# Patient Record
Sex: Male | Born: 1985 | Race: White | Hispanic: No | Marital: Married | Smoking: Former smoker
Health system: Southern US, Community
[De-identification: ages and names within clinical notes are randomized; demographics above are authoritative.]

## PROBLEM LIST (undated history)

## (undated) DIAGNOSIS — K219 Gastro-esophageal reflux disease without esophagitis: Secondary | ICD-10-CM

## (undated) HISTORY — PX: APPENDECTOMY: SHX54

## (undated) HISTORY — DX: Gastro-esophageal reflux disease without esophagitis: K21.9

---
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
## 2014-01-08 ENCOUNTER — Ambulatory Visit (INDEPENDENT_AMBULATORY_CARE_PROVIDER_SITE_OTHER): Payer: Medicaid Other | Admitting: Family Medicine

## 2014-01-08 ENCOUNTER — Encounter: Payer: Self-pay | Admitting: Family Medicine

## 2014-01-08 VITALS — BP 122/68 | Temp 98.0°F | Ht 69.0 in | Wt 210.0 lb

## 2014-01-08 DIAGNOSIS — K219 Gastro-esophageal reflux disease without esophagitis: Secondary | ICD-10-CM

## 2014-01-08 MED ORDER — ESOMEPRAZOLE MAGNESIUM 40 MG PO CPDR: 40.0000 mg | DELAYED_RELEASE_CAPSULE | Freq: Every day | ORAL | Status: DC

## 2014-01-08 NOTE — Patient Instructions (Signed)
Gastroesophageal Reflux Disease, Adult  Gastroesophageal reflux disease (GERD) happens when acid from your stomach goes into your food pipe (esophagus). The acid can cause a burning feeling in your chest. Over time, the acid can make small holes (ulcers) in your food pipe.   HOME CARE  · Ask your doctor for advice about:  · Losing weight.  · Quitting smoking.  · Alcohol use.  · Avoid foods and drinks that make your problems worse. You may want to avoid:  · Caffeine and alcohol.  · Chocolate.  · Mints.  · Garlic and onions.  · Spicy foods.  · Citrus fruits, such as oranges, lemons, or limes.  · Foods that contain tomato, such as sauce, chili, salsa, and pizza.  · Fried and fatty foods.  · Avoid lying down for 3 hours before you go to bed or before you take a nap.  · Eat small meals often, instead of large meals.  · Wear loose-fitting clothing. Do not wear anything tight around your waist.  · Raise (elevate) the head of your bed 6 to 8 inches with wood blocks. Using extra pillows does not help.  · Only take medicines as told by your doctor.  · Do not take aspirin or ibuprofen.  GET HELP RIGHT AWAY IF:   · You have pain in your arms, neck, jaw, teeth, or back.  · Your pain gets worse or changes.  · You feel sick to your stomach (nauseous), throw up (vomit), or sweat (diaphoresis).  · You feel short of breath, or you pass out (faint).  · Your throw up is green, yellow, black, or looks like coffee grounds or blood.  · Your poop (stool) is red, bloody, or black.  MAKE SURE YOU:   · Understand these instructions.  · Will watch your condition.  · Will get help right away if you are not doing well or get worse.  Document Released: 03/09/2008 Document Revised: 12/14/2011 Document Reviewed: 04/10/2011  ExitCare® Patient Information ©2014 [employer].

## 2014-01-09 ENCOUNTER — Telehealth: Payer: Self-pay | Admitting: *Deleted

## 2014-01-09 MED ORDER — PANTOPRAZOLE SODIUM 40 MG PO TBEC: 40.0000 mg | DELAYED_RELEASE_TABLET | Freq: Every day | ORAL | Status: DC

## 2014-01-09 NOTE — Telephone Encounter (Signed)
Prior Authorization received from [facility] for Nexium. Formulary and PA form placed in provider box for completion. Clovis PuMartin, Tamika L, RN

## 2014-01-09 NOTE — Telephone Encounter (Signed)
Medication changed to Protonix per Dr. Birdie SonsSonnenberg.  Rx sent to [facility] listed on pt's record.  Clovis PuMartin, Tamika L, RN

## 2014-01-11 ENCOUNTER — Encounter: Payer: Self-pay | Admitting: Family Medicine

## 2014-01-11 DIAGNOSIS — K219 Gastro-esophageal reflux disease without esophagitis: Secondary | ICD-10-CM | POA: Insufficient documentation

## 2014-01-11 NOTE — Assessment & Plan Note (Signed)
Patient with reflux. Nexium not covered by his insurance. Will treat with protonix. Asked that he bring his records from [place] in at his PE.

## 2014-01-11 NOTE — Progress Notes (Signed)
Patient ID: Aaron DoseClinton B Holmes, male   DOB: 10/29/1985, 28 y.o.   MRN: [id number]  Marikay AlarEric Sonnenberg, MD Phone: [phone]  Aaron Holmes is a 28 y.o. male who presents today for new patient visit.  GERD: patient with history of reflux for several years. He was previously treated in [place] for this with nexium. States he has been out of this medication for the past year. He has been buying the 20 mg tabs OTC and states these have not been working well. He has had to double up on these. He notes getting heart burn mostly at night. Also after eating spicey foods. Had an EGD in [place], they reportedly saw ulcers, though he does not think they took any biopsies.  Patient is a smoker.   Patient Information Form: Screening and ROS  Do you feel safe in relationships? yes PHQ-2:negative  Review of Symptoms  General:  Negative for nexplained weight loss, fever Skin: Negative for new or changing mole, sore that won't heal HEENT: Negative for trouble hearing, trouble seeing, ringing in ears, mouth sores, hoarseness, change in voice, dysphagia. CV:  Negative for chest pain, dyspnea, edema, palpitations Resp: Negative for cough, dyspnea, hemoptysis GI: Positive for acid reflux, Negative for nausea, vomiting, diarrhea, constipation, abdominal pain, melena, hematochezia. GU: Negative for dysuria, incontinence, urinary hesitance, hematuria, vaginal or penile discharge, polyuria, sexual difficulty, lumps in testicle or breasts MSK: Negative for muscle cramps or aches, joint pain or swelling Neuro: Positive for HA, Negative for weakness, numbness, dizziness, passing out/fainting Psych: Negative for depression, anxiety, memory problems    Physical Exam Filed Vitals:   01/08/14 1508  BP: 122/68  Temp: 98 F (36.7 C)    Gen: Well NAD HEENT: EOMI,  MMM Lungs: CTABL Nl WOB Heart: RRR no MRG Abd: NABS, NT, ND Exts: Non edematous BL  LE, warm and well perfused.   Assessment/Plan: Please see  individual problem list.

## 2014-03-01 ENCOUNTER — Ambulatory Visit (INDEPENDENT_AMBULATORY_CARE_PROVIDER_SITE_OTHER): Payer: Medicaid Other | Admitting: Family Medicine

## 2014-03-01 ENCOUNTER — Encounter: Payer: Self-pay | Admitting: Family Medicine

## 2014-03-01 VITALS — BP 126/77 | HR 90 | Temp 98.2°F | Ht 69.0 in | Wt 200.0 lb

## 2014-03-01 DIAGNOSIS — M79609 Pain in unspecified limb: Secondary | ICD-10-CM

## 2014-03-01 DIAGNOSIS — M79603 Pain in arm, unspecified: Secondary | ICD-10-CM

## 2014-03-01 DIAGNOSIS — Z23 Encounter for immunization: Secondary | ICD-10-CM

## 2014-03-01 NOTE — Progress Notes (Signed)
Patient ID: Aaron Holmes, male   DOB: [birth year], 28 y.o.   MRN: [id number]  Aaron Alar, MD Phone: [phone]  Aaron Holmes is a 28 y.o. male who presents today for new problem.  Patient reports bilateral arm pain for the last 2-3 weeks. Notes this started after placing a stainless steel bracelet on his right hand. He notes he removed this and the pain went away. He then noted pain in his left hand with his wedding band and this went away after removing his wedding band. Pain is located from the MCP joints in both hands moving up the back of his forearm. States the pain is not present at this time. Other than removing the metal bracelet and his wedding ring nothing had made this better. Nothing has made it worse. He has not taken any medications for this. He denies any weakness, numbness, or tingling. He has never had this before. He notes he does lots of repetitive movements with his hands at his new job.  Patient is a smoker.   ROS: Per HPI   Physical Exam Filed Vitals:   03/01/14 1339  BP: 126/77  Pulse: 90  Temp: 98.2 F (36.8 C)    Gen: Well NAD MSK: bilateral UE without signs of swelling or erythema, patient has full ROM in wrists, elbows, and shoulders, there is no tenderness to palpation at any point in bilateral UE, negative tinnel sign bilaterall Neuro: 5/5 strength in bilateral biceps, triceps, deltoids, and grip, sensation to light touch intact in bilateral UE, 2+ reflexes bilateral biceps and brachioradialis   Assessment/Plan: Please see individual problem list.

## 2014-03-01 NOTE — Patient Instructions (Signed)
Nice to see you. Unfortunately I do not have a great explanation for your pain. Please try the exercises provided to help relive some of the pain if it recurs. You can also try avoiding wearing metal on your hands and wrists. If not improving please let me know.

## 2014-03-01 NOTE — Assessment & Plan Note (Signed)
Patient with bilateral arm pain that has now resolved with removal of metal objects. Nothing on exam to point to a specific etiology. Unlikely to be related to wearing metal, though may have been related to compression with the bracelet. I have advised the patient to avoid wearing anything on his wrists and hands at this time. I gave him some exercises for carpal tunnel as that would be my best guess as a cause at this time. Advised to f/u prn if not staying improved.

## 2014-03-27 ENCOUNTER — Ambulatory Visit (INDEPENDENT_AMBULATORY_CARE_PROVIDER_SITE_OTHER): Payer: Medicaid Other | Admitting: Family Medicine

## 2014-03-27 ENCOUNTER — Encounter: Payer: Self-pay | Admitting: Family Medicine

## 2014-03-27 VITALS — BP 119/81 | HR 88 | Temp 98.1°F | Wt 197.0 lb

## 2014-03-27 DIAGNOSIS — Z13220 Encounter for screening for lipoid disorders: Secondary | ICD-10-CM

## 2014-03-27 DIAGNOSIS — T148 Other injury of unspecified body region: Secondary | ICD-10-CM

## 2014-03-27 DIAGNOSIS — Z136 Encounter for screening for cardiovascular disorders: Secondary | ICD-10-CM

## 2014-03-27 DIAGNOSIS — W57XXXA Bitten or stung by nonvenomous insect and other nonvenomous arthropods, initial encounter: Secondary | ICD-10-CM

## 2014-03-27 DIAGNOSIS — Z0000 Encounter for general adult medical examination without abnormal findings: Secondary | ICD-10-CM | POA: Insufficient documentation

## 2014-03-27 MED ORDER — BACITRACIN 500 UNIT/GM EX OINT: 1.0000 "application " | TOPICAL_OINTMENT | Freq: Two times a day (BID) | CUTANEOUS | Status: DC

## 2014-03-27 NOTE — Assessment & Plan Note (Addendum)
Patient with normal blood pressure. Discussed smoking cessation, though patient with out the money at this time to afford the patch. He is to let us know if he desires help in the future in quitting. Lipid panel collected today. Reports previous negative HIV test. Will see back in one year for CPE.

## 2014-03-27 NOTE — Patient Instructions (Signed)
Nice to see you. Please use the antibiotic ointment on your bug bites. If they get worse or spread please let us know. If you develop fever or chills please seek medical attention. If you want help quitting smoking in the future please let us know. I will see you back in one year.

## 2014-03-27 NOTE — Progress Notes (Signed)
Patient ID: Aaron Holmes, male   DOB: 04/04/1986, 28 y.o.   MRN: [id number]  Aaron AlarEric Alfio Loescher, MD Phone: [phone]  Aaron Holmes is a 28 y.o. male who presents today for CPE.  Bug bites: patient notes 2 weekends ago he was at a pool. The next day he noted bug bites on his legs bilaterally. They itched. They have been draining clear fluid. There is mild erythema around them. He states they have gotten worse. He has used hydrocortisone cream on them and benadryl with minimal benefit. He notes his father in law has similar lesions. No one he lives with has had these lesions. He did not see what bug bit him.  Patient is a current smoker. He notes previous use of marijuana. He drinks alcohol 2-4x/month.  No new medical issues. He notes he was tested for HIV while in the [employer]. This was negative. He is sexually active with only his wife. No condoms used.  Review of Symptoms  General:  Negative for unexplained weight loss, fever Skin: Positive for bug bites HEENT: Negative for trouble hearing, trouble seeing CV:  Negative for chest pain, dyspnea, edema Resp: Negative for cough, dyspnea GI: Negative for nausea, vomiting, diarrhea, abdominal pain GU: Negative for dysuria, incontinence, urinary hesitance MSK: Negative for muscle cramps or aches, joint pain or swelling Neuro: Negative for headaches, weakness Psych: Negative for depression, anxiety, memory problems    Physical Exam Filed Vitals:   03/27/14 1427  BP: 119/81  Pulse: 88  Temp: 98.1 F (36.7 C)    Gen: Well NAD HEENT: PERRL,  MMM Neck: no cervical LAD Lungs: CTABL Nl WOB Heart: RRR no MRG Abd: NABS, NT, ND Neuro: alert, moves all 4 extremities equally Exts: Non edematous BL  LE, warm and well perfused.  Skin: scattered excoriated lesions with intermittent pustules on bilateral LE, small amount of erythema at edges of excoriated areas  Assessment/Plan: Please see individual problem list.  # Healthcare  maintenance: lipid panel ordered

## 2014-03-27 NOTE — Assessment & Plan Note (Signed)
Patient with bug bites from unknown bug. Excoriated and will give bacitracin ointment to help prevent secondary infection. No other house hold members have these and given distribution and pattern doubt scabies or bed bugs. Can continue benadryl for itching. Given return precautions.

## 2014-03-28 LAB — LIPID PANEL
CHOLESTEROL: 178 mg/dL (ref 0–200)
HDL: 34 mg/dL — ABNORMAL LOW (ref >39)
LDL CALC: 70 mg/dL (ref 0–99)
TRIGLYCERIDES: 372 mg/dL — AB (ref <150)
Total CHOL/HDL Ratio: 5.2 Ratio
VLDL: 74 mg/dL — AB (ref 0–40)

## 2014-04-04 ENCOUNTER — Encounter: Payer: Self-pay | Admitting: Family Medicine

## 2014-07-25 ENCOUNTER — Other Ambulatory Visit: Payer: Self-pay | Admitting: Family Medicine

## 2014-08-17 ENCOUNTER — Ambulatory Visit: Payer: Medicaid Other | Admitting: Family Medicine

## 2014-12-30 ENCOUNTER — Emergency Department (HOSPITAL_COMMUNITY)
Admission: EM | Admit: 2014-12-30 | Discharge: 2014-12-30 | Disposition: A | Discharge status: Home / Self Care | Payer: Medicaid Other | Attending: Emergency Medicine | Admitting: Emergency Medicine

## 2014-12-30 ENCOUNTER — Encounter (HOSPITAL_COMMUNITY): Payer: Self-pay | Admitting: Emergency Medicine

## 2014-12-30 ENCOUNTER — Emergency Department (HOSPITAL_COMMUNITY): Payer: Medicaid Other

## 2014-12-30 DIAGNOSIS — Z720 Tobacco use: Secondary | ICD-10-CM | POA: Diagnosis not present

## 2014-12-30 DIAGNOSIS — K219 Gastro-esophageal reflux disease without esophagitis: Secondary | ICD-10-CM | POA: Diagnosis not present

## 2014-12-30 DIAGNOSIS — Y9389 Activity, other specified: Secondary | ICD-10-CM | POA: Diagnosis not present

## 2014-12-30 DIAGNOSIS — Y998 Other external cause status: Secondary | ICD-10-CM | POA: Insufficient documentation

## 2014-12-30 DIAGNOSIS — Z792 Long term (current) use of antibiotics: Secondary | ICD-10-CM | POA: Insufficient documentation

## 2014-12-30 DIAGNOSIS — S61012A Laceration without foreign body of left thumb without damage to nail, initial encounter: Secondary | ICD-10-CM | POA: Diagnosis present

## 2014-12-30 DIAGNOSIS — W260XXA Contact with knife, initial encounter: Secondary | ICD-10-CM | POA: Diagnosis not present

## 2014-12-30 DIAGNOSIS — Y9289 Other specified places as the place of occurrence of the external cause: Secondary | ICD-10-CM | POA: Diagnosis not present

## 2014-12-30 DIAGNOSIS — Z79899 Other long term (current) drug therapy: Secondary | ICD-10-CM | POA: Insufficient documentation

## 2014-12-30 MED ORDER — LIDOCAINE HCL 2 % IJ SOLN
10.0000 mL | Freq: Once | INTRAMUSCULAR | Status: AC
  Administered 2014-12-30: 200 mg via INTRADERMAL
  Filled 2014-12-30: qty 20

## 2014-12-30 NOTE — ED Notes (Signed)
Patient transported to X-ray 

## 2014-12-30 NOTE — ED Notes (Addendum)
Pt states that he cut his thumb with a pocket knife. The area is about 1 inch long. Bleeding is controlled at this time.

## 2014-12-30 NOTE — ED Notes (Addendum)
Tatyana PA at bedside   

## 2014-12-30 NOTE — ED Notes (Signed)
Pt reports approx 1 hour ago cut L thumb with pocket knife. Bleeding controlled. Last tetanus 3 year ago.

## 2014-12-30 NOTE — ED Provider Notes (Signed)
CSN: [id number]     Arrival date & time 12/30/14  1906 History  This chart was scribed for non-physician practitioner, Jaynie Crumbleatyana Jamiaya Bina, PA-C working with Samuel JesterKathleen McManus, DO by Greggory StallionKayla Andersen, ED scribe. This patient was seen in room TR08C/TR08C and the patient's care was started at 7:30 PM.    Chief Complaint  Patient presents with  . Extremity Laceration   The history is provided by the patient. No language interpreter was used.    HPI Comments: Aaron Holmes is a 29 y.o. male who presents to the Emergency Department complaining of a laceration to his left thumb that occurred about one hour ago. Pt accidentally cut himself with a pocket knife. It was clean. He reports mild pain around the area. Pt's last tetanus was 3 years ago.   Past Medical History  Diagnosis Date  . GERD (gastroesophageal reflux disease)    Past Surgical History  Procedure Laterality Date  . Appendectomy     Family History  Problem Relation Age of Onset  . Diabetes Mother   . Heart disease Father   . Hypertension Father   . Alzheimer's disease Maternal Grandmother   . Cancer Maternal Grandfather   . Diabetes Paternal Grandmother   . Stroke Paternal Grandmother    History  Substance Use Topics  . Smoking status: Current Every Day Smoker  . Smokeless tobacco: Not on file     Comment: 1 pack last 2-3 days  . Alcohol Use: Yes     Comment: 2-4x/month    Review of Systems  Musculoskeletal: Positive for arthralgias.  Skin: Positive for wound.  All other systems reviewed and are negative.  Allergies  Review of patient's allergies indicates no known allergies.  Home Medications   Prior to Admission medications   Medication Sig Start Date End Date Taking? Authorizing Provider  bacitracin 500 UNIT/GM ointment Apply 1 application topically 2 (two) times daily. 03/27/14   Glori LuisEric G Sonnenberg, MD  pantoprazole (PROTONIX) 40 MG tablet TAKE 1 TABLET (40 MG TOTAL) BY MOUTH DAILY. 07/25/14   Glori LuisEric G  Sonnenberg, MD   BP 135/75 mmHg  Pulse 91  Temp(Src) 98.2 F (36.8 C) (Oral)  Resp 18  Ht 5\' 9"  (1.753 m)  Wt 205 lb (92.987 kg)  BMI 30.26 kg/m2  SpO2 95%   Physical Exam  Constitutional: He is oriented to person, place, and time. He appears well-developed and well-nourished. No distress.  HENT:  Head: Normocephalic and atraumatic.  Eyes: Conjunctivae and EOM are normal.  Neck: Neck supple. No tracheal deviation present.  Cardiovascular: Normal rate.   Pulmonary/Chest: Effort normal. No respiratory distress.  Musculoskeletal: Normal range of motion.  3 cm deep laceration over IP joint of palmar surface of the left thumb. Full range of motion of the thumb. Full strength with flexion and extension against resistance. Cap refill less than 2 seconds distally. Sensation is intact over palmar and dorsal surfaces of the thumb.  Neurological: He is alert and oriented to person, place, and time.  Skin: Skin is warm and dry.  Psychiatric: He has a normal mood and affect. His behavior is normal.  Nursing note and vitals reviewed.   ED Course  Procedures (including critical care time)  LACERATION REPAIR PROCEDURE NOTE The patient's identification was confirmed and consent was obtained. This procedure was performed by Jaynie Crumbleatyana Shirin Echeverry, PA-C at 8:23 PM. Site: left thumb Sterile procedures observed Anesthetic used (type and amt): 4 mL 2% lidocaine without epi Suture type/size: 5-0 Prolene Length: 3 cm #  of Sutures: 6 Technique: simple interrupted Complexity: simple  Antibx ointment applied Tetanus UTD Site anesthetized, irrigated with NS, explored without evidence of foreign body, wound well approximated, site covered with dry, sterile dressing.  Patient tolerated procedure well without complications. Instructions for care discussed verbally and patient provided with additional written instructions for homecare and f/u.  DIAGNOSTIC STUDIES: Oxygen Saturation is 95% on RA,  adequate by my interpretation.    COORDINATION OF CARE: 7:32 PM-Discussed treatment plan which includes xray with pt at bedside and pt agreed to plan.   8:18 PM-Advised pt of xray results. Will repair laceration and discharge home.   Labs Review Labs Reviewed - No data to display  Imaging Review No results found.   EKG Interpretation None      MDM   Final diagnoses:  Laceration of thumb, left, initial encounter    patient had laceration to the palmar surface of the left thumb. Strength, neurovascularly is intact. X-rays negative. Wound repaired with sutures. Wound appears to be clean. Topical about X follow-up with primary care doctor as needed.   Filed Vitals:   12/30/14 1910 12/30/14 2057  BP: 135/75 124/72  Pulse: 91 80  Temp: 98.2 F (36.8 C) 98 F (36.7 C)  TempSrc: Oral Oral  Resp: 18 19  Height:  (1.753 m)   Weight: 205 lb (92.987 kg)   SpO2: 95% 99%    I personally performed the services described in this documentation, which was scribed in my presence. The recorded information has been reviewed and is accurate.   Jaynie Crumble, PA-C 12/30/14 2246  Samuel Jester, DO 12/31/14 0127

## 2014-12-30 NOTE — Discharge Instructions (Signed)
Keep your laceration cleaned. Bacitracin ointment twice a day. Keep it covered. Follow-up in 7 days for suture removal. Watch for any signs of infection. You can take ibuprofen or Tylenol for pain as needed  Laceration Care, Adult A laceration is a cut or lesion that goes through all layers of the skin and into the tissue just beneath the skin. TREATMENT  Some lacerations may not require closure. Some lacerations may not be able to be closed due to an increased risk of infection. It is important to see your caregiver as soon as possible after an injury to minimize the risk of infection and maximize the opportunity for successful closure. If closure is appropriate, pain medicines may be given, if needed. The wound will be cleaned to help prevent infection. Your caregiver will use stitches (sutures), staples, wound glue (adhesive), or skin adhesive strips to repair the laceration. These tools bring the skin edges together to allow for faster healing and a better cosmetic outcome. However, all wounds will heal with a scar. Once the wound has healed, scarring can be minimized by covering the wound with sunscreen during the day for 1 full year. HOME CARE INSTRUCTIONS  For sutures or staples:  Keep the wound clean and dry.  If you were given a bandage (dressing), you should change it at least once a day. Also, change the dressing if it becomes wet or dirty, or as directed by your caregiver.  Wash the wound with soap and water 2 times a day. Rinse the wound off with water to remove all soap. Pat the wound dry with a clean towel.  After cleaning, apply a thin layer of the antibiotic ointment as recommended by your caregiver. This will help prevent infection and keep the dressing from sticking.  You may shower as usual after the first 24 hours. Do not soak the wound in water until the sutures are removed.  Only take over-the-counter or prescription medicines for pain, discomfort, or fever as directed by  your caregiver.  Get your sutures or staples removed as directed by your caregiver. For skin adhesive strips:  Keep the wound clean and dry.  Do not get the skin adhesive strips wet. You may bathe carefully, using caution to keep the wound dry.  If the wound gets wet, pat it dry with a clean towel.  Skin adhesive strips will fall off on their own. You may trim the strips as the wound heals. Do not remove skin adhesive strips that are still stuck to the wound. They will fall off in time. For wound adhesive:  You may briefly wet your wound in the shower or bath. Do not soak or scrub the wound. Do not swim. Avoid periods of heavy perspiration until the skin adhesive has fallen off on its own. After showering or bathing, gently pat the wound dry with a clean towel.  Do not apply liquid medicine, cream medicine, or ointment medicine to your wound while the skin adhesive is in place. This may loosen the film before your wound is healed.  If a dressing is placed over the wound, be careful not to apply tape directly over the skin adhesive. This may cause the adhesive to be pulled off before the wound is healed.  Avoid prolonged exposure to sunlight or tanning lamps while the skin adhesive is in place. Exposure to ultraviolet light in the first year will darken the scar.  The skin adhesive will usually remain in place for 5 to 10 days, then naturally  fall off the skin. Do not pick at the adhesive film. You may need a tetanus shot if:  You cannot remember when you had your last tetanus shot.  You have never had a tetanus shot. If you get a tetanus shot, your arm may swell, get red, and feel warm to the touch. This is common and not a problem. If you need a tetanus shot and you choose not to have one, there is a rare chance of getting tetanus. Sickness from tetanus can be serious. SEEK MEDICAL CARE IF:   You have redness, swelling, or increasing pain in the wound.  You see a red line that goes  away from the wound.  You have yellowish-white fluid (pus) coming from the wound.  You have a fever.  You notice a bad smell coming from the wound or dressing.  Your wound breaks open before or after sutures have been removed.  You notice something coming out of the wound such as wood or glass.  Your wound is on your hand or foot and you cannot move a finger or toe. SEEK IMMEDIATE MEDICAL CARE IF:   Your pain is not controlled with prescribed medicine.  You have severe swelling around the wound causing pain and numbness or a change in color in your arm, hand, leg, or foot.  Your wound splits open and starts bleeding.  You have worsening numbness, weakness, or loss of function of any joint around or beyond the wound.  You develop painful lumps near the wound or on the skin anywhere on your body. MAKE SURE YOU:   Understand these instructions.  Will watch your condition.  Will get help right away if you are not doing well or get worse. Document Released: 09/21/2005 Document Revised: 12/14/2011 Document Reviewed: 03/17/2011 [facility] Patient Information 2015 [place], [place]. This information is not intended to replace advice given to you by your health care provider. Make sure you discuss any questions you have with your health care provider.

## 2015-01-13 ENCOUNTER — Other Ambulatory Visit: Payer: Self-pay | Admitting: Family Medicine

## 2015-08-23 ENCOUNTER — Other Ambulatory Visit: Payer: Self-pay | Admitting: *Deleted

## 2015-08-23 MED ORDER — PANTOPRAZOLE SODIUM 40 MG PO TBEC: 40.0000 mg | DELAYED_RELEASE_TABLET | Freq: Every day | ORAL | Status: DC

## 2015-10-13 IMAGING — CR DG FINGER THUMB 2+V*L*
3 series · 3 of 3 positions shown · non-contrast
Comparison: None.

CLINICAL DATA: Recent injury with pocket knife, initial encounter

EXAM:
LEFT THUMB 2+V

[finger ap]
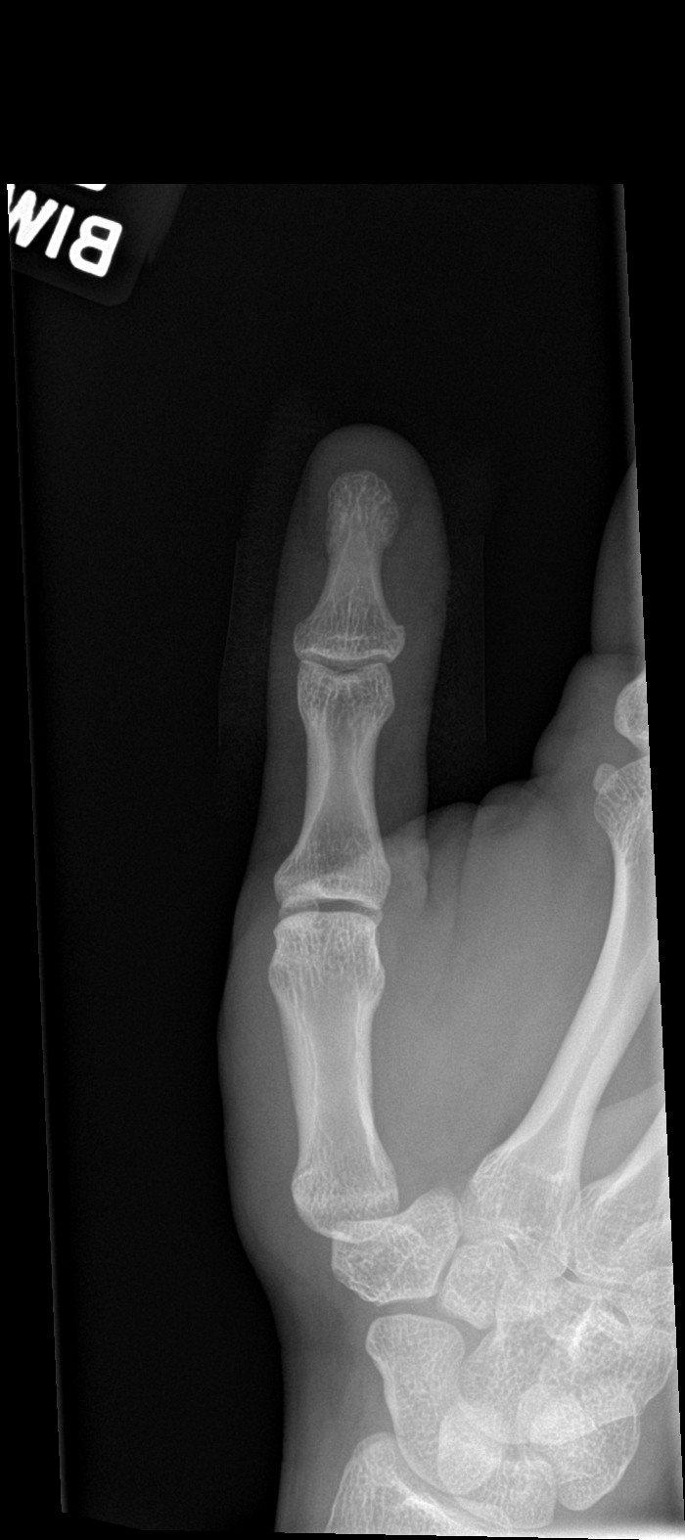

[finger obl]
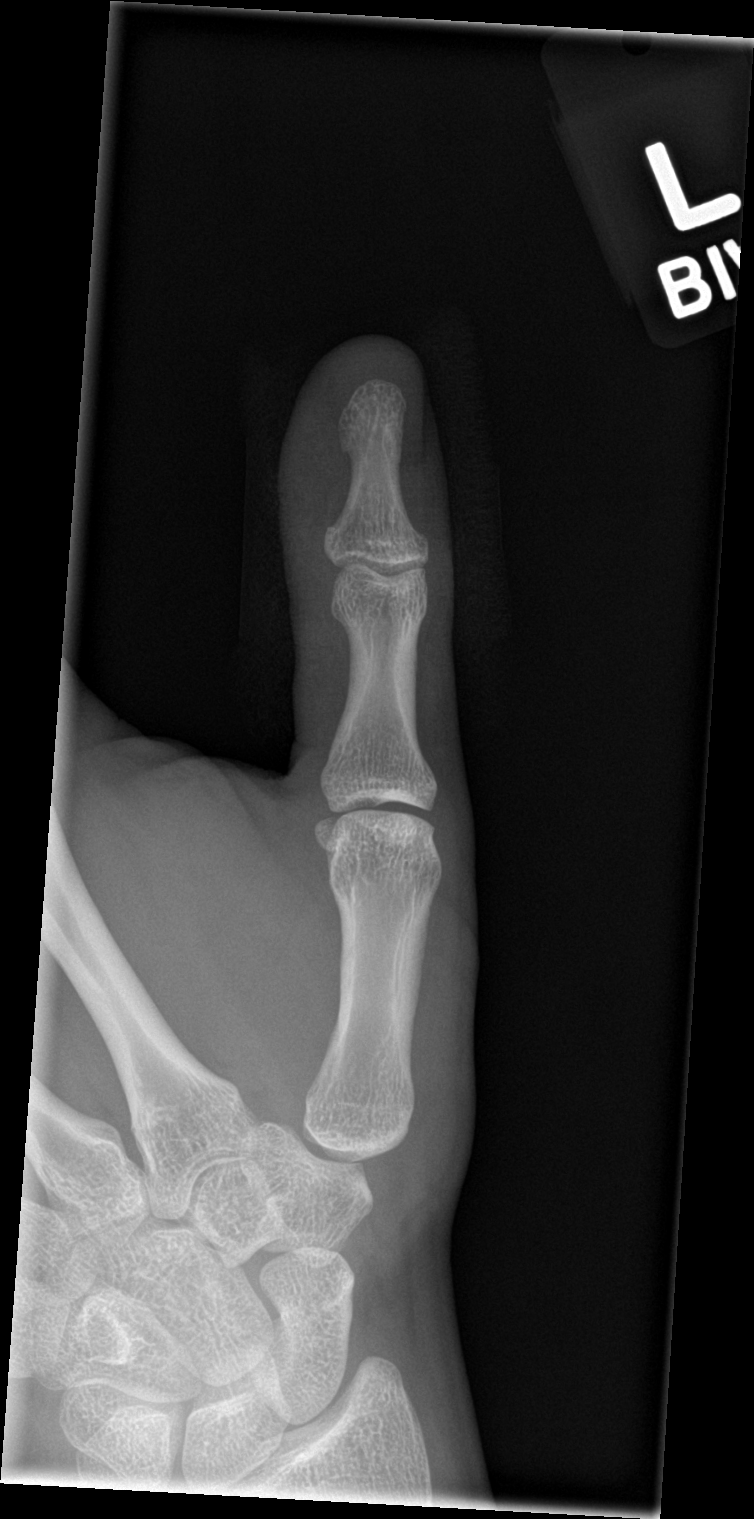

[finger lat]
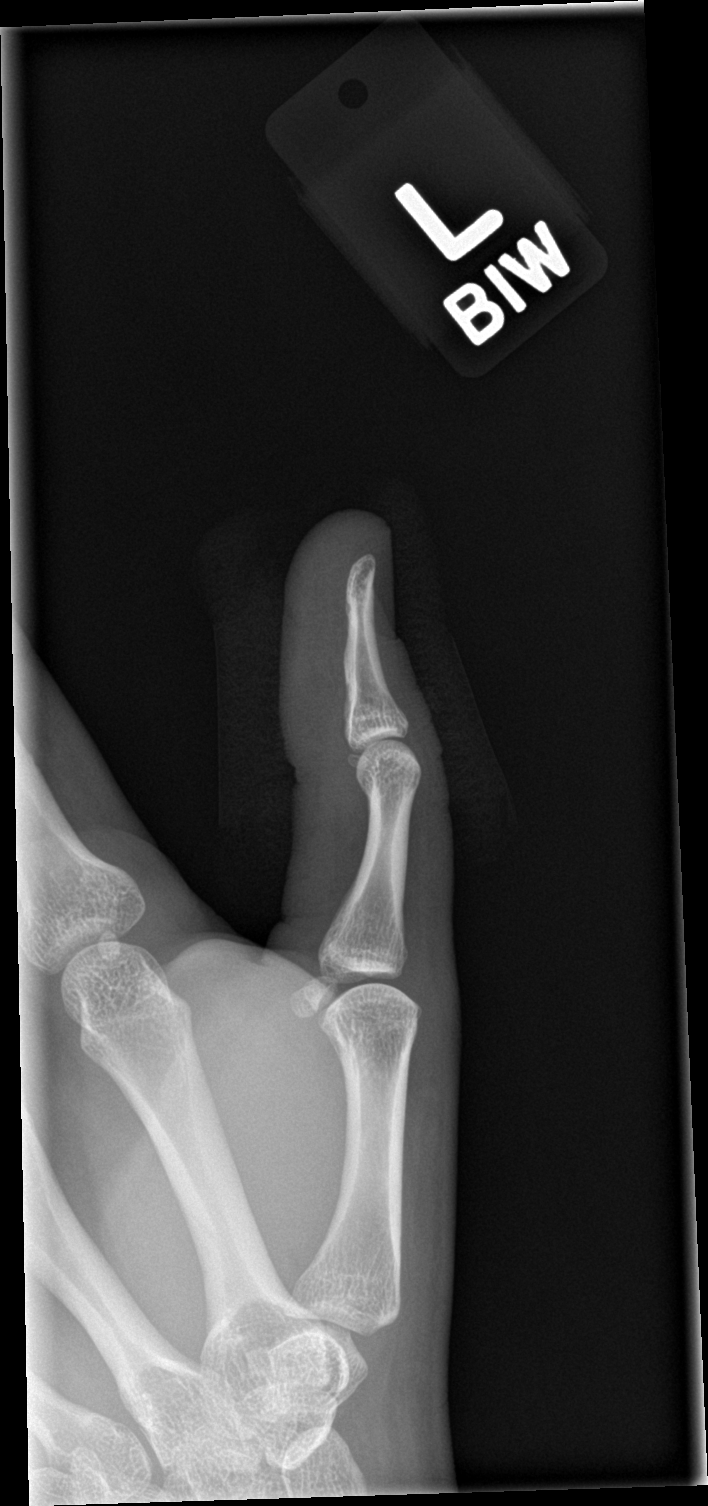

[3 of 3 positions shown; findings below may reference images not displayed]

FINDINGS: No radiopaque foreign body is noted. No acute bony abnormality is
seen. Mild soft tissue irregularity is noted consistent with the
given clinical history.
IMPRESSION: Soft tissue laceration without bony abnormality.

## 2016-01-06 ENCOUNTER — Emergency Department (HOSPITAL_COMMUNITY)
Admission: EM | Admit: 2016-01-06 | Discharge: 2016-01-06 | Disposition: A | Discharge status: Home / Self Care | Payer: Managed Care, Other (non HMO) | Attending: Emergency Medicine | Admitting: Emergency Medicine

## 2016-01-06 ENCOUNTER — Encounter (HOSPITAL_COMMUNITY): Payer: Self-pay

## 2016-01-06 DIAGNOSIS — Z79899 Other long term (current) drug therapy: Secondary | ICD-10-CM | POA: Diagnosis not present

## 2016-01-06 DIAGNOSIS — F17200 Nicotine dependence, unspecified, uncomplicated: Secondary | ICD-10-CM | POA: Insufficient documentation

## 2016-01-06 DIAGNOSIS — Z7982 Long term (current) use of aspirin: Secondary | ICD-10-CM | POA: Insufficient documentation

## 2016-01-06 DIAGNOSIS — R1110 Vomiting, unspecified: Secondary | ICD-10-CM | POA: Diagnosis not present

## 2016-01-06 DIAGNOSIS — K219 Gastro-esophageal reflux disease without esophagitis: Secondary | ICD-10-CM | POA: Insufficient documentation

## 2016-01-06 DIAGNOSIS — R197 Diarrhea, unspecified: Secondary | ICD-10-CM | POA: Diagnosis present

## 2016-01-06 LAB — I-STAT CHEM 8, ED
BUN: 18 mg/dL (ref 6–20)
Calcium, Ion: 1.08 mmol/L — ABNORMAL LOW (ref 1.12–1.23)
Chloride: 103 mmol/L (ref 101–111)
Creatinine, Ser: 1.1 mg/dL (ref 0.61–1.24)
Glucose, Bld: 104 mg/dL — ABNORMAL HIGH (ref 65–99)
HCT: 44 % (ref 39.0–52.0)
HEMOGLOBIN: 15 g/dL (ref 13.0–17.0)
Potassium: 3.8 mmol/L (ref 3.5–5.1)
SODIUM: 138 mmol/L (ref 135–145)
TCO2: 21 mmol/L (ref 0–100)

## 2016-01-06 MED ORDER — SODIUM CHLORIDE 0.9 % IV BOLUS (SEPSIS)
1000.0000 mL | Freq: Once | INTRAVENOUS | Status: AC
  Administered 2016-01-06: 1000 mL via INTRAVENOUS

## 2016-01-06 NOTE — ED Notes (Signed)
Pt. Presents with complaint of diarrhea starting Friday. Pt.states had N/V as well but has since resolved. Pt. Reports eating and drinking ok since Saturday.

## 2016-01-06 NOTE — ED Provider Notes (Signed)
CSN: [id number]     Arrival date & time 01/06/16  0707 History   First MD Initiated Contact with Patient 01/06/16 [phone]     Chief Complaint  Patient presents with  . Diarrhea     (Consider location/radiation/quality/duration/timing/severity/associated sxs/prior Treatment) HPI Comments: 30 year old male with history of reflux presents with recurrent diarrhea. Patient had vomiting that resolved followed by diarrhea since Friday. 6 episodes per day nonbloody no fevers no chills. No recent travel. No sick if can sick contact patient does not work in healthcare facility. No history of C. difficile.  Patient is a 30 y.o. male presenting with diarrhea. The history is provided by the patient.  Diarrhea Associated symptoms: vomiting   Associated symptoms: no abdominal pain, no chills, no fever and no headaches     Past Medical History  Diagnosis Date  . GERD (gastroesophageal reflux disease)    Past Surgical History  Procedure Laterality Date  . Appendectomy     Family History  Problem Relation Age of Onset  . Diabetes Mother   . Heart disease Father   . Hypertension Father   . Alzheimer's disease Maternal Grandmother   . Cancer Maternal Grandfather   . Diabetes Paternal Grandmother   . Stroke Paternal Grandmother    Social History  Substance Use Topics  . Smoking status: Current Every Day Smoker  . Smokeless tobacco: None     Comment: 1 pack last 2-3 days  . Alcohol Use: Yes     Comment: 2-4x/month    Review of Systems  Constitutional: Negative for fever and chills.  HENT: Negative for congestion.   Eyes: Negative for visual disturbance.  Respiratory: Negative for shortness of breath.   Cardiovascular: Negative for chest pain.  Gastrointestinal: Positive for vomiting and diarrhea. Negative for abdominal pain.  Genitourinary: Negative for dysuria and flank pain.  Musculoskeletal: Negative for back pain, neck pain and neck stiffness.  Skin: Negative for rash.  Neurological:  Negative for light-headedness and headaches.      Allergies  Review of patient's allergies indicates no known allergies.  Home Medications   Prior to Admission medications   Medication Sig Start Date End Date Taking? Authorizing Provider  aspirin-acetaminophen-caffeine (EXCEDRIN MIGRAINE) [phone] MG tablet Take 1 tablet by mouth every 6 (six) hours as needed for headache.   Yes Historical Provider, MD  Aspirin-Acetaminophen-Caffeine (GOODY HEADACHE PO) Take 1 packet by mouth every 8 (eight) hours as needed (headache).   Yes Historical Provider, MD  esomeprazole (NEXIUM) 40 MG capsule Take 40 mg by mouth daily. 12/16/15  Yes Historical Provider, MD  fenofibrate 160 MG tablet Take 160 mg by mouth daily. 12/16/15  Yes Historical Provider, MD  Multiple Vitamin (MULTIVITAMIN) tablet Take 1 tablet by mouth daily.   Yes Historical Provider, MD  OMEGA-3 KRILL OIL PO Take 1 capsule by mouth daily.   Yes Historical Provider, MD  bacitracin 500 UNIT/GM ointment Apply 1 application topically 2 (two) times daily. Patient not taking: Reported on 01/06/2016 03/27/14   Glori Luis, MD  pantoprazole (PROTONIX) 40 MG tablet Take 1 tablet (40 mg total) by mouth daily. Patient not taking: Reported on 01/06/2016 08/23/15   Hillery Hunter Melancon, MD   BP 134/85 mmHg  Pulse 102  Temp(Src) 98.1 F (36.7 C) (Oral)  Resp 18  Ht  (1.753 m)  Wt 210 lb (95.255 kg)  BMI 31.00 kg/m2  SpO2 98% Physical Exam  Constitutional: He is oriented to person, place, and time. He appears well-developed and well-nourished.  HENT:  Head: Normocephalic and atraumatic.  Mild dry mm  Eyes: Conjunctivae are normal. Right eye exhibits no discharge. Left eye exhibits no discharge.  Neck: Normal range of motion. Neck supple. No tracheal deviation present.  Cardiovascular: Normal rate and regular rhythm.   Pulmonary/Chest: Effort normal and breath sounds normal.  Abdominal: Soft. He exhibits no distension. There is no  tenderness. There is no guarding.  Musculoskeletal: He exhibits no edema.  Neurological: He is alert and oriented to person, place, and time.  Skin: Skin is warm. No rash noted.  Psychiatric: He has a normal mood and affect.  Nursing note and vitals reviewed.   ED Course  Procedures (including critical care time) Labs Review Labs Reviewed  I-STAT CHEM 8, ED - Abnormal; Notable for the following:    Glucose, Bld 104 (*)    Calcium, Ion 1.08 (*)    All other components within normal limits    Imaging Review No results found. I have personally reviewed and evaluated these images and lab results as part of my medical decision-making.   EKG Interpretation None      MDM   Final diagnoses:  Diarrhea, unspecified type   Patient presents with clinically gastroenteritis. Overall well-appearing mild dehydration. Patient feels he's not keeping up with his volume losses. Plan for i-STAT chem 8, 1 L fluid bolus, work note and outpatient follow-up.  Results and differential diagnosis were discussed with the patient/parent/guardian. Xrays were independently reviewed by myself.  Close follow up outpatient was discussed, comfortable with the plan.   Medications  sodium chloride 0.9 % bolus 1,000 mL (0 mLs Intravenous Stopped 01/06/16 0855)    Filed Vitals:   01/06/16 0712 01/06/16 0730  BP: 147/89 134/85  Pulse: 110 102  Temp: 98.1 F (36.7 C) 98.1 F (36.7 C)  TempSrc: Oral Oral  Resp: 18 18  Height:  5\' 9"  (1.753 m)  Weight:  210 lb (95.255 kg)  SpO2: 97% 98%    Final diagnoses:  Diarrhea, unspecified type        Blane OharaJoshua Jaslynn Thome, MD 01/06/16 [phone]

## 2016-01-06 NOTE — Discharge Instructions (Signed)
If you were given medicines take as directed.  If you are on coumadin or contraceptives realize their levels and effectiveness is altered by many different medicines.  If you have any reaction (rash, tongues swelling, other) to the medicines stop taking and see a physician.    If your blood pressure was elevated in the ER make sure you follow up for management with a primary doctor or return for chest pain, shortness of breath or stroke symptoms.  Please follow up as directed and return to the ER or see a physician for new or worsening symptoms.  Thank you. Filed Vitals:   01/06/16 0712 01/06/16 0730  BP: 147/89 134/85  Pulse: 110 102  Temp: 98.1 F (36.7 C) 98.1 F (36.7 C)  TempSrc: Oral Oral  Resp: 18 18  Height:  5\' 9"  (1.753 m)  Weight:  210 lb (95.255 kg)  SpO2: 97% 98%

## 2016-01-24 ENCOUNTER — Other Ambulatory Visit: Payer: Self-pay | Admitting: Family Medicine

## 2016-01-24 DIAGNOSIS — R1011 Right upper quadrant pain: Secondary | ICD-10-CM

## 2016-01-28 ENCOUNTER — Ambulatory Visit
Admission: RE | Admit: 2016-01-28 | Discharge: 2016-01-28 | Disposition: A | Discharge status: Home / Self Care | Payer: Managed Care, Other (non HMO) | Source: Ambulatory Visit | Attending: Family Medicine | Admitting: Family Medicine

## 2016-01-28 DIAGNOSIS — R1011 Right upper quadrant pain: Secondary | ICD-10-CM

## 2016-11-10 IMAGING — US US ABDOMEN COMPLETE
1 series · 14 of 25 positions shown · non-contrast
Comparison: None.

CLINICAL DATA: Right upper quadrant pain

EXAM:
ABDOMEN ULTRASOUND COMPLETE

[Series 1: us abdomen complete · 0.35mm/px · 14 of 87 slices shown]
[im 1/87]
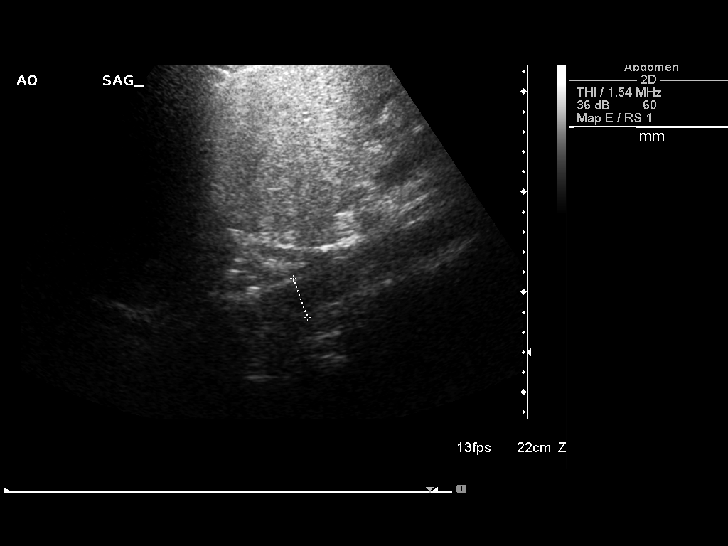
[im 8/87]
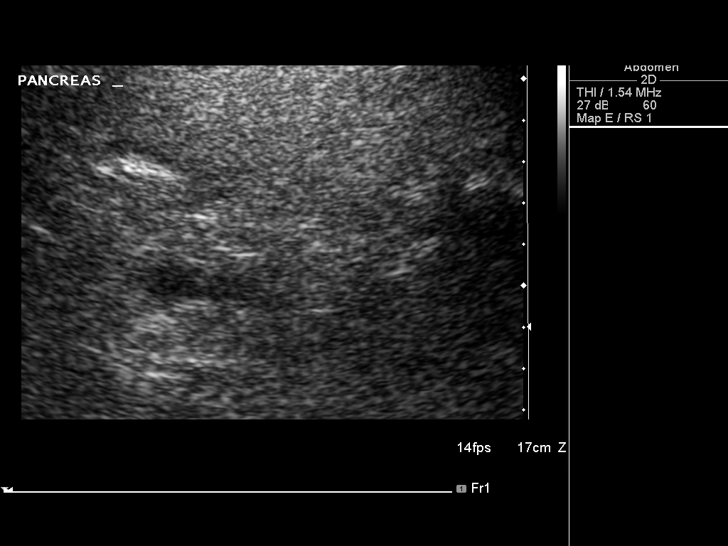
[im 15/87]
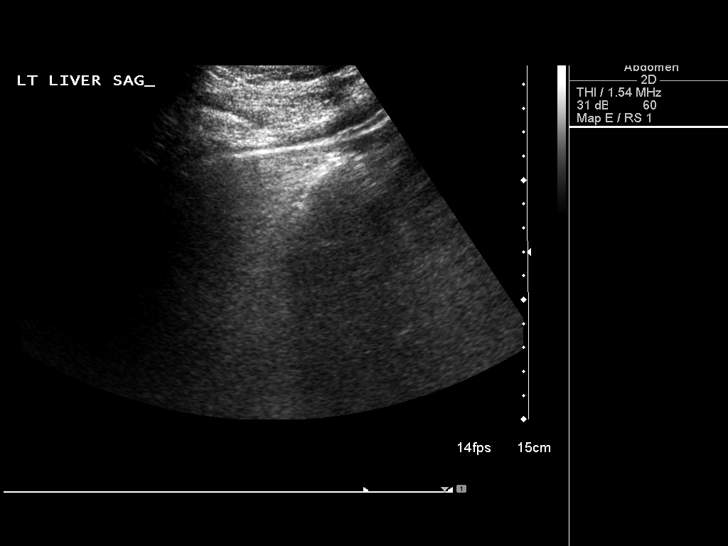
[im 22/87]
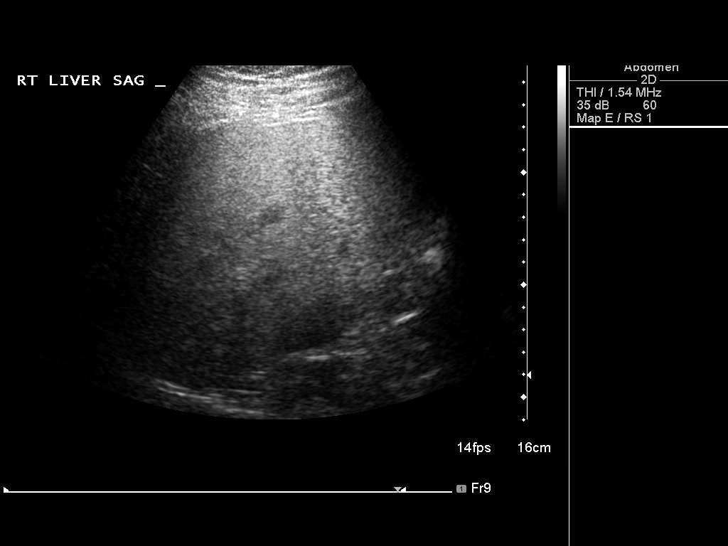
[im 29/87]
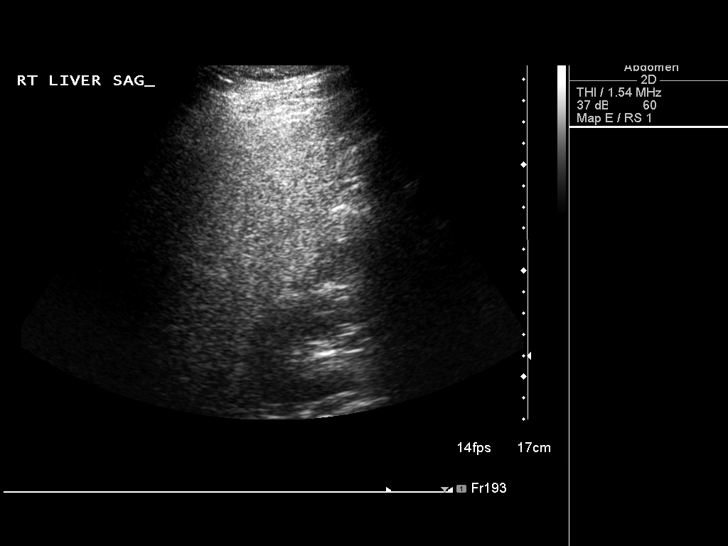
[im 33/87]
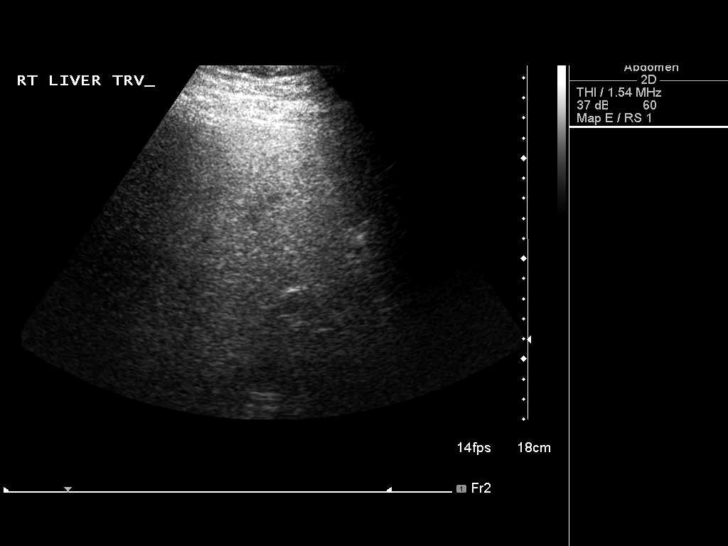
[im 40/87]
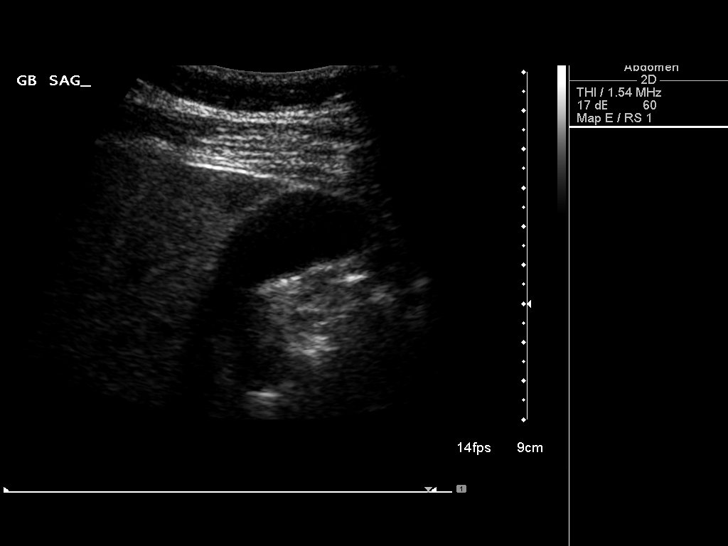
[im 47/87]
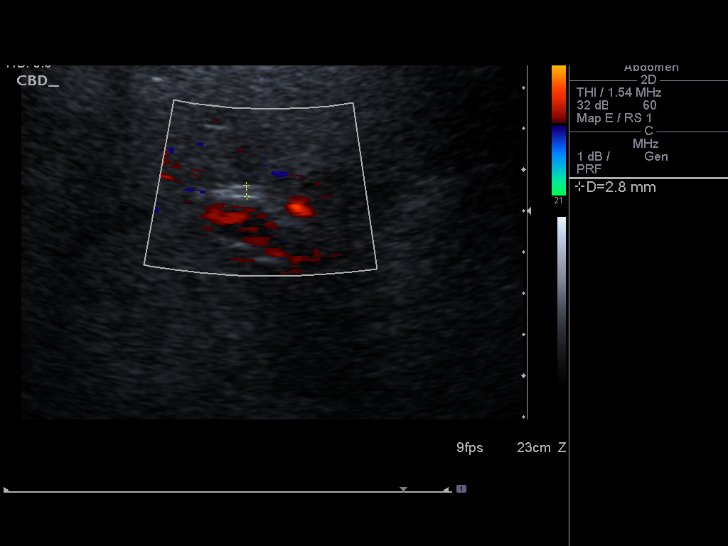
[im 54/87]
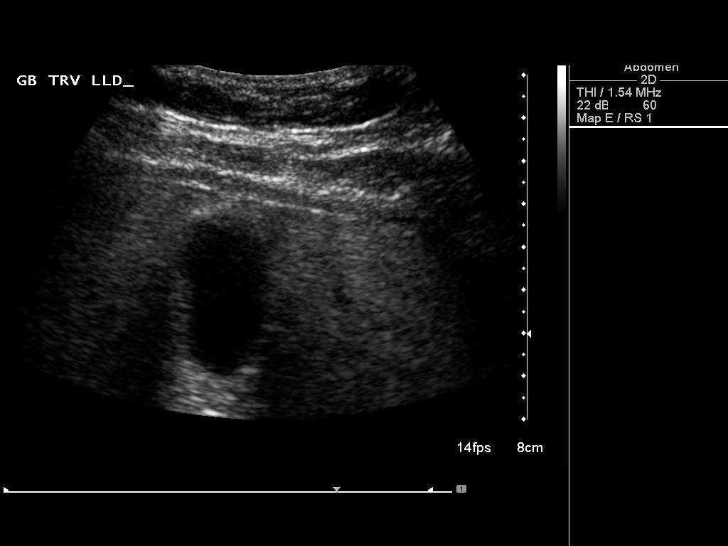
[im 58/87]
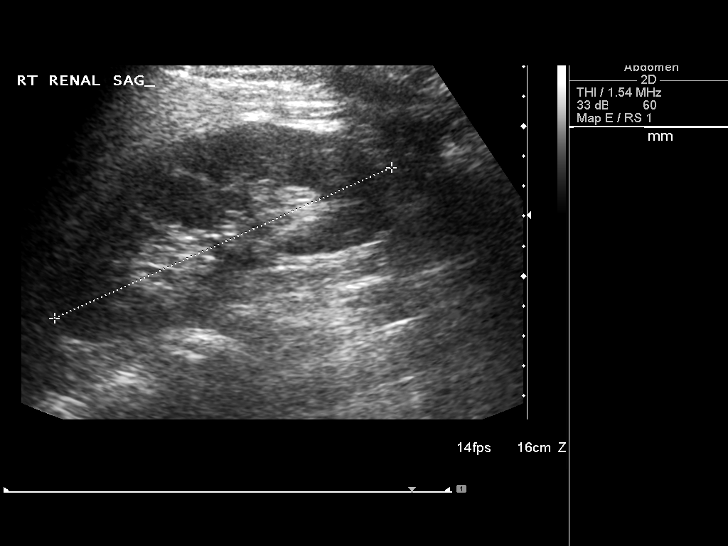
[im 65/87]
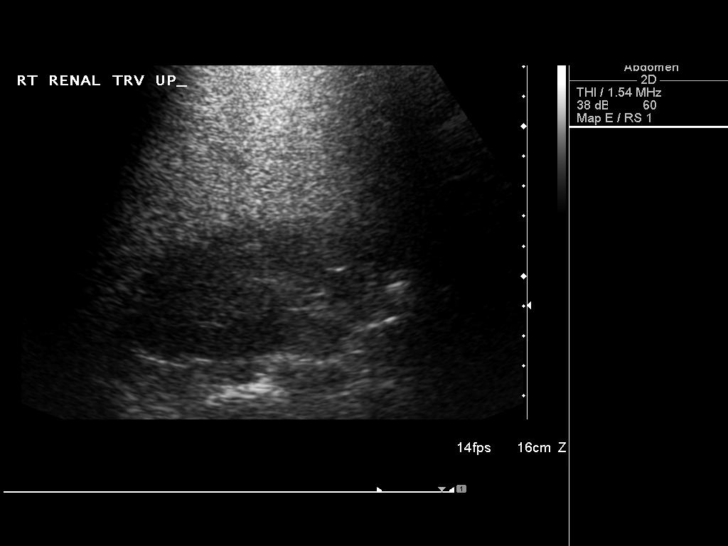
[im 72/87]
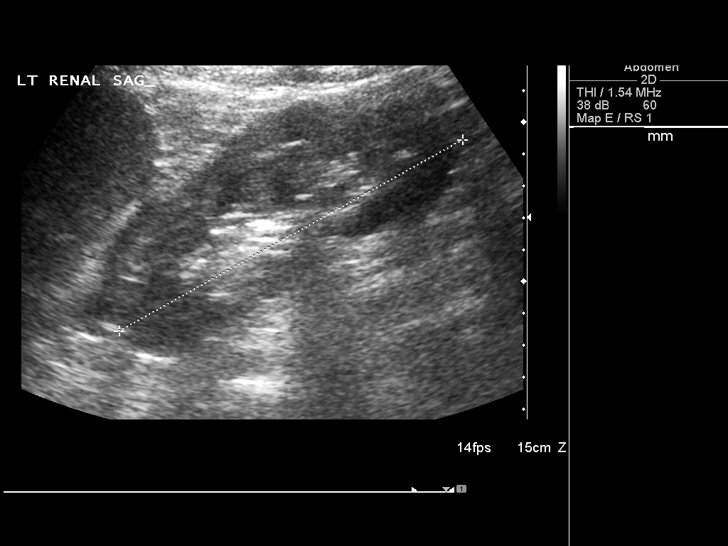
[im 79/87]
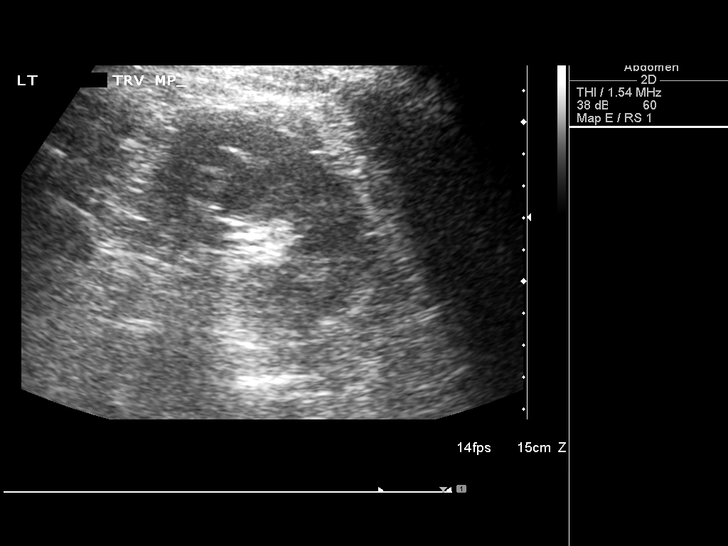
[im 87/87]
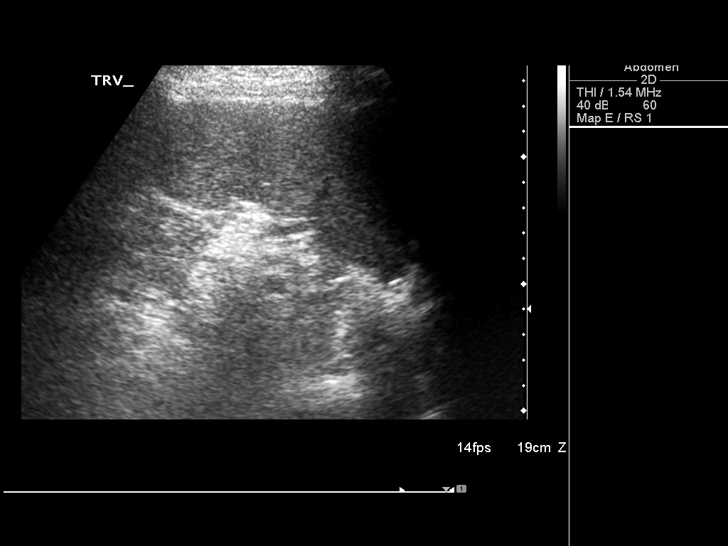

[14 of 25 positions shown; findings below may reference images not displayed]

FINDINGS: Gallbladder: No gallstones or wall thickening visualized. No
sonographic Murphy sign noted by sonographer.

Common bile duct: Diameter: Normal caliber, 3 mm

Liver: Increased echotexture diffusely throughout the liver
compatible with fatty infiltration. No focal abnormality.

IVC: No abnormality visualized.

Pancreas: Visualized portion unremarkable.

Spleen: Size and appearance within normal limits.

Right Kidney: Length: 12.3 cm. Echogenicity within normal limits. No
mass or hydronephrosis visualized.

Left Kidney: Length: 12.3 cm. Echogenicity within normal limits. No
mass or hydronephrosis visualized.

Abdominal aorta: No aneurysm visualized.

Other findings: None.
IMPRESSION: Unremarkable study.

## 2017-07-18 ENCOUNTER — Emergency Department (HOSPITAL_COMMUNITY)
Admission: EM | Admit: 2017-07-18 | Discharge: 2017-07-18 | Disposition: A | Discharge status: Home / Self Care | Payer: Managed Care, Other (non HMO) | Attending: Emergency Medicine | Admitting: Emergency Medicine

## 2017-07-18 ENCOUNTER — Encounter (HOSPITAL_COMMUNITY): Payer: Self-pay | Admitting: Emergency Medicine

## 2017-07-18 DIAGNOSIS — M5441 Lumbago with sciatica, right side: Secondary | ICD-10-CM | POA: Diagnosis not present

## 2017-07-18 DIAGNOSIS — M5442 Lumbago with sciatica, left side: Secondary | ICD-10-CM | POA: Insufficient documentation

## 2017-07-18 DIAGNOSIS — Z79899 Other long term (current) drug therapy: Secondary | ICD-10-CM | POA: Diagnosis not present

## 2017-07-18 DIAGNOSIS — M545 Low back pain: Secondary | ICD-10-CM | POA: Diagnosis present

## 2017-07-18 DIAGNOSIS — Z87891 Personal history of nicotine dependence: Secondary | ICD-10-CM | POA: Diagnosis not present

## 2017-07-18 MED ORDER — OXYCODONE-ACETAMINOPHEN 5-325 MG PO TABS: 1.0000 | ORAL_TABLET | ORAL | 0 refills | Status: DC | PRN

## 2017-07-18 MED ORDER — PREDNISONE 10 MG PO TABS: 10.0000 mg | ORAL_TABLET | Freq: Every day | ORAL | 0 refills | Status: DC

## 2017-07-18 MED ORDER — HYDROMORPHONE HCL 1 MG/ML IJ SOLN
1.0000 mg | Freq: Once | INTRAMUSCULAR | Status: AC
  Administered 2017-07-18: 1 mg via INTRAMUSCULAR
  Filled 2017-07-18: qty 1

## 2017-07-18 MED ORDER — NAPROXEN 500 MG PO TABS: 500.0000 mg | ORAL_TABLET | Freq: Two times a day (BID) | ORAL | 0 refills | Status: DC

## 2017-07-18 NOTE — ED Triage Notes (Signed)
Pt reports lower back pain severe in nature beginning 2 days ago with no injury and tingling down left leg.

## 2017-07-18 NOTE — Discharge Instructions (Signed)
Prescription for pain medicine, prednisone, anti-inflammatory medication.

## 2017-07-18 NOTE — ED Provider Notes (Signed)
[facility]-EMERGENCY DEPT Provider Note   CSN: [id number] Arrival date & time: 07/18/17  1411     History   Chief Complaint Chief Complaint  Patient presents with  . Back Pain    HPI Aaron Holmes is a 31 y.o. male.  Mid lower back pain for 2 days after cutting up wood from the recent [place]. Radiation to bilateral buttocks area. Patient has had back pain in the past, but this is more significant. No bowel or bladder incontinence. He has tried over-the-counter medicine and heated home. Severity is moderate.      Past Medical History:  Diagnosis Date  . GERD (gastroesophageal reflux disease)     Patient Active Problem List   Diagnosis Date Noted  . Bug bites 03/27/2014  . Physical exam, annual 03/27/2014  . Arm pain 03/01/2014  . GERD (gastroesophageal reflux disease) 01/11/2014    Past Surgical History:  Procedure Laterality Date  . APPENDECTOMY         Home Medications    Prior to Admission medications   Medication Sig Start Date End Date Taking? Authorizing Provider  aspirin-acetaminophen-caffeine (EXCEDRIN MIGRAINE) [phone] MG tablet Take 1 tablet by mouth every 6 (six) hours as needed for headache.    [provider]  Aspirin-Acetaminophen-Caffeine (GOODY HEADACHE PO) Take 1 packet by mouth every 8 (eight) hours as needed (headache).    [provider]  bacitracin 500 UNIT/GM ointment Apply 1 application topically 2 (two) times daily. Patient not taking: Reported on 01/06/2016 03/27/14   Glori Luis, MD  esomeprazole (NEXIUM) 40 MG capsule Take 40 mg by mouth daily. 12/16/15   [provider]  fenofibrate 160 MG tablet Take 160 mg by mouth daily. 12/16/15   [provider]  Multiple Vitamin (MULTIVITAMIN) tablet Take 1 tablet by mouth daily.    [provider]  naproxen (NAPROSYN) 500 MG tablet Take 1 tablet (500 mg total) by mouth 2 (two) times daily. 07/18/17   Donnetta Hutching, MD  OMEGA-3 KRILL OIL PO Take 1  capsule by mouth daily.    [provider]  oxyCODONE-acetaminophen (PERCOCET) 5-325 MG tablet Take 1-2 tablets by mouth every 4 (four) hours as needed. 07/18/17   Donnetta Hutching, MD  pantoprazole (PROTONIX) 40 MG tablet Take 1 tablet (40 mg total) by mouth daily. Patient not taking: Reported on 01/06/2016 08/23/15   Melancon, Hillery Hunter, MD  predniSONE (DELTASONE) 10 MG tablet Take 1 tablet (10 mg total) by mouth daily with breakfast. 3 tablets for 3 days, 2 tablets for 3 days, one tablet for 3 days 07/18/17   Donnetta Hutching, MD    Family History Family History  Problem Relation Age of Onset  . Diabetes Mother   . Heart disease Father   . Hypertension Father   . Alzheimer's disease Maternal Grandmother   . Cancer Maternal Grandfather   . Diabetes Paternal Grandmother   . Stroke Paternal Grandmother     Social History Social History  Substance Use Topics  . Smoking status: Former Games developer  . Smokeless tobacco: Not on file  . Alcohol use Yes     Comment: 2-4x/month     Allergies   Patient has no known allergies.   Review of Systems Review of Systems  All other systems reviewed and are negative.    Physical Exam Updated Vital Signs BP 130/86 (BP Location: Left Arm)   Pulse 78   Temp 98.4 F (36.9 C) (Oral)   Resp 18   Ht  (  1.753 m)   Wt 97.5 kg (215 lb)   SpO2 98%   BMI 31.75 kg/m   Physical Exam  Constitutional: He is oriented to person, place, and time. He appears well-developed and well-nourished.  HENT:  Head: Normocephalic and atraumatic.  Eyes: Conjunctivae are normal.  Neck: Neck supple.  Cardiovascular: Normal rate and regular rhythm.   Pulmonary/Chest: Effort normal and breath sounds normal.  Abdominal: Soft. Bowel sounds are normal.  Musculoskeletal:  paraspinous tenderness in the L2, 3, 4 area.  Pain with straight leg raise bilaterally.  Neurological: He is alert and oriented to person, place, and time.  Skin: Skin is warm and dry.    Psychiatric: He has a normal mood and affect. His behavior is normal.  Nursing note and vitals reviewed.    ED Treatments / Results  Labs (all labs ordered are listed, but only abnormal results are displayed) Labs Reviewed - No data to display  EKG  EKG Interpretation None       Radiology No results found.  Procedures Procedures (including critical care time)  Medications Ordered in ED Medications  HYDROmorphone (DILAUDID) injection 1 mg (1 mg Intramuscular Given 07/18/17 1450)     Initial Impression / Assessment and Plan / ED Course  I have reviewed the triage vital signs and the nursing notes.  Pertinent labs & imaging results that were available during my care of the patient were reviewed by me and considered in my medical decision making (see chart for details).     Patient has low back pain with bilateral sciatica.  Dilaudid 1 mg IM in the ED.  Discharge medications Percocet, Naprosyn 500 mg, prednisone. Patient understands he may have a herniated disc and may need further workup.  Final Clinical Impressions(s) / ED Diagnoses   Final diagnoses:  Acute bilateral low back pain with bilateral sciatica    New Prescriptions New Prescriptions   NAPROXEN (NAPROSYN) 500 MG TABLET    Take 1 tablet (500 mg total) by mouth 2 (two) times daily.   OXYCODONE-ACETAMINOPHEN (PERCOCET) 5-325 MG TABLET    Take 1-2 tablets by mouth every 4 (four) hours as needed.   PREDNISONE (DELTASONE) 10 MG TABLET    Take 1 tablet (10 mg total) by mouth daily with breakfast. 3 tablets for 3 days, 2 tablets for 3 days, one tablet for 3 days     Donnetta Hutching, MD 07/18/17 1517

## 2021-04-26 ENCOUNTER — Other Ambulatory Visit: Payer: Self-pay

## 2021-04-26 ENCOUNTER — Emergency Department (HOSPITAL_COMMUNITY)
Admission: EM | Admit: 2021-04-26 | Discharge: 2021-04-26 | Disposition: A | Discharge status: Home / Self Care | Payer: 59 | Attending: Emergency Medicine | Admitting: Emergency Medicine

## 2021-04-26 ENCOUNTER — Encounter (HOSPITAL_COMMUNITY): Payer: Self-pay | Admitting: *Deleted

## 2021-04-26 DIAGNOSIS — Z87891 Personal history of nicotine dependence: Secondary | ICD-10-CM | POA: Diagnosis not present

## 2021-04-26 DIAGNOSIS — S90861A Insect bite (nonvenomous), right foot, initial encounter: Secondary | ICD-10-CM | POA: Diagnosis present

## 2021-04-26 DIAGNOSIS — W57XXXA Bitten or stung by nonvenomous insect and other nonvenomous arthropods, initial encounter: Secondary | ICD-10-CM | POA: Insufficient documentation

## 2021-04-26 MED ORDER — NAPROXEN 500 MG PO TABS: ORAL_TABLET | ORAL | 0 refills | Status: AC

## 2021-04-26 NOTE — ED Provider Notes (Signed)
[facility] EMERGENCY DEPARTMENT Provider Note   CSN: [id number] Arrival date & time: 04/26/21  0258     History Chief Complaint  Patient presents with   Insect Bite    Aaron Holmes is a 35 y.o. male.  Patient was stung on the bottom of the right foot.  He complains of swelling and pain throughout his foot  The history is provided by the patient and medical records. No language interpreter was used.  Foot Pain This is a new problem. The current episode started 2 days ago. The problem has not changed since onset.Pertinent negatives include no chest pain, no abdominal pain and no headaches. Nothing aggravates the symptoms. Nothing relieves the symptoms. He has tried nothing for the symptoms.      Past Medical History:  Diagnosis Date   GERD (gastroesophageal reflux disease)     Patient Active Problem List   Diagnosis Date Noted   Bug bites 03/27/2014   Physical exam, annual 03/27/2014   Arm pain 03/01/2014   GERD (gastroesophageal reflux disease) 01/11/2014    Past Surgical History:  Procedure Laterality Date   APPENDECTOMY         Family History  Problem Relation Age of Onset   Diabetes Mother    Heart disease Father    Hypertension Father    Alzheimer's disease Maternal Grandmother    Cancer Maternal Grandfather    Diabetes Paternal Grandmother    Stroke Paternal Grandmother     Social History   Tobacco Use   Smoking status: Former   Smokeless tobacco: Never  Substance Use Topics   Alcohol use: Yes    Comment: 2-4x/month   Drug use: No    Home Medications Prior to Admission medications   Medication Sig Start Date End Date Taking? Authorizing Provider  naproxen (NAPROSYN) 500 MG tablet Take 1 Naprosyn every 12 hours for swelling or discomfort 04/26/21  Yes Bethann Berkshire, MD  aspirin-acetaminophen-caffeine (EXCEDRIN MIGRAINE) [phone] MG tablet Take 1 tablet by mouth every 6 (six) hours as needed for headache.    [provider]   Aspirin-Acetaminophen-Caffeine (GOODY HEADACHE PO) Take 1 packet by mouth every 8 (eight) hours as needed (headache).    [provider]  bacitracin 500 UNIT/GM ointment Apply 1 application topically 2 (two) times daily. Patient not taking: Reported on 01/06/2016 03/27/14   Glori Luis, MD  esomeprazole (NEXIUM) 40 MG capsule Take 40 mg by mouth daily. 12/16/15   [provider]  fenofibrate 160 MG tablet Take 160 mg by mouth daily. 12/16/15   [provider]  Multiple Vitamin (MULTIVITAMIN) tablet Take 1 tablet by mouth daily.    [provider]  OMEGA-3 KRILL OIL PO Take 1 capsule by mouth daily.    [provider]  oxyCODONE-acetaminophen (PERCOCET) 5-325 MG tablet Take 1-2 tablets by mouth every 4 (four) hours as needed. 07/18/17   Donnetta Hutching, MD  pantoprazole (PROTONIX) 40 MG tablet Take 1 tablet (40 mg total) by mouth daily. Patient not taking: Reported on 01/06/2016 08/23/15   Melancon, Hillery Hunter, MD  predniSONE (DELTASONE) 10 MG tablet Take 1 tablet (10 mg total) by mouth daily with breakfast. 3 tablets for 3 days, 2 tablets for 3 days, one tablet for 3 days 07/18/17   Donnetta Hutching, MD    Allergies    Patient has no known allergies.  Review of Systems   Review of Systems  Constitutional:  Negative for appetite change and fatigue.  HENT:  Negative for  congestion, ear discharge and sinus pressure.   Eyes:  Negative for discharge.  Respiratory:  Negative for cough.   Cardiovascular:  Negative for chest pain.  Gastrointestinal:  Negative for abdominal pain and diarrhea.  Genitourinary:  Negative for frequency and hematuria.  Musculoskeletal:  Negative for back pain.       Right foot pain  Skin:  Negative for rash.  Neurological:  Negative for seizures and headaches.  Psychiatric/Behavioral:  Negative for hallucinations.    Physical Exam Updated Vital Signs BP 126/83 (BP Location: Left Arm)   Pulse 88   Temp 98.4 F (36.9 C) (Oral)    Resp 18   Ht 5\' 9"  (1.753 m)   Wt 96.2 kg   SpO2 97%   BMI 31.31 kg/m   Physical Exam Vitals and nursing note reviewed.  Constitutional:      Appearance: He is well-developed.  HENT:     Head: Normocephalic.     Nose: Nose normal.  Eyes:     Conjunctiva/sclera: Conjunctivae normal.  Neck:     Trachea: No tracheal deviation.  Cardiovascular:     Heart sounds: No murmur heard. Musculoskeletal:        General: Normal range of motion.     Comments: Minimal tenderness to right fourth toe  Skin:    General: Skin is warm.  Neurological:     Mental Status: He is alert and oriented to person, place, and time.    ED Results / Procedures / Treatments   Labs (all labs ordered are listed, but only abnormal results are displayed) Labs Reviewed - No data to display  EKG None  Radiology No results found.  Procedures Procedures   Medications Ordered in ED Medications - No data to display  ED Course  I have reviewed the triage vital signs and the nursing notes.  Pertinent labs & imaging results that were available during my care of the patient were reviewed by me and considered in my medical decision making (see chart for details).    MDM Rules/Calculators/A&P                           Patient with insect bite to right foot.  Patient with local inflammation.  He will take Naprosyn for pain and swelling and Benadryl for itching follow-up as needed Final Clinical Impression(s) / ED Diagnoses Final diagnoses:  Insect bite of right foot, initial encounter    Rx / DC Orders ED Discharge Orders          Ordered    naproxen (NAPROSYN) 500 MG tablet        04/26/21 04/28/21, MD 04/28/21 1117

## 2021-04-26 NOTE — ED Triage Notes (Signed)
Pt with insect bite to right 5 th toe that occurred around 10 this morning. Pt felt something bit him but did see the insect.  Right  foot is swollen.

## 2021-04-26 NOTE — Discharge Instructions (Addendum)
Use Benadryl for itching and follow-up with your family doctor if not improved

## 2021-05-20 ENCOUNTER — Encounter (HOSPITAL_COMMUNITY): Payer: Self-pay | Admitting: Emergency Medicine

## 2021-05-20 ENCOUNTER — Emergency Department (HOSPITAL_COMMUNITY)
Admission: EM | Admit: 2021-05-20 | Discharge: 2021-05-21 | Disposition: A | Discharge status: Home / Self Care | Payer: Managed Care, Other (non HMO) | Attending: Emergency Medicine | Admitting: Emergency Medicine

## 2021-05-20 ENCOUNTER — Other Ambulatory Visit: Payer: Self-pay

## 2021-05-20 DIAGNOSIS — M5441 Lumbago with sciatica, right side: Secondary | ICD-10-CM | POA: Insufficient documentation

## 2021-05-20 DIAGNOSIS — Z87891 Personal history of nicotine dependence: Secondary | ICD-10-CM | POA: Diagnosis not present

## 2021-05-20 DIAGNOSIS — M545 Low back pain, unspecified: Secondary | ICD-10-CM | POA: Diagnosis present

## 2021-05-20 MED ORDER — KETOROLAC TROMETHAMINE 60 MG/2ML IM SOLN
60.0000 mg | Freq: Once | INTRAMUSCULAR | Status: AC
  Administered 2021-05-21: 60 mg via INTRAMUSCULAR
  Filled 2021-05-20: qty 2

## 2021-05-20 MED ORDER — PREDNISONE 20 MG PO TABS: ORAL_TABLET | ORAL | 0 refills | Status: DC

## 2021-05-20 MED ORDER — METHOCARBAMOL 500 MG PO TABS: 500.0000 mg | ORAL_TABLET | Freq: Three times a day (TID) | ORAL | 0 refills | Status: DC | PRN

## 2021-05-20 MED ORDER — METHOCARBAMOL 500 MG PO TABS
500.0000 mg | ORAL_TABLET | Freq: Once | ORAL | Status: AC
  Administered 2021-05-21: 500 mg via ORAL
  Filled 2021-05-20: qty 1

## 2021-05-20 MED ORDER — MELOXICAM 7.5 MG PO TABS: 7.5000 mg | ORAL_TABLET | Freq: Every day | ORAL | 0 refills | Status: DC

## 2021-05-20 MED ORDER — METHYLPREDNISOLONE SODIUM SUCC 125 MG IJ SOLR
125.0000 mg | Freq: Once | INTRAMUSCULAR | Status: AC
  Administered 2021-05-21: 125 mg via INTRAMUSCULAR
  Filled 2021-05-20: qty 2

## 2021-05-20 MED ORDER — OXYCODONE-ACETAMINOPHEN 5-325 MG PO TABS
2.0000 | ORAL_TABLET | Freq: Once | ORAL | Status: AC
  Administered 2021-05-21: 2 via ORAL
  Filled 2021-05-20: qty 2

## 2021-05-20 NOTE — ED Triage Notes (Signed)
Pt c/o lower back pain that started this morning. Pt states pain goes down right leg.

## 2021-05-21 NOTE — ED Provider Notes (Signed)
[facility] EMERGENCY DEPARTMENT Provider Note   CSN: [id number] Arrival date & time: 05/20/21  1920     History Chief Complaint  Patient presents with   Sciatica    Aaron Holmes is a 35 y.o. male.  35 year old male that states he was in his truck yesterday was twisting to grab something his back locked up.  Patient states his bilateral lower back.  He does radiate down his right leg.  States that it is a sharp stabbing pain in his right leg.  He has no history of sciatica.  He has no history of IV drug use, cancer, diabetes, trauma or other known causes for the pain.  Patient states that throughout the day today it has been worse with movement.  Better with rest.  Tried some over-the-counter medications at home which did not seem to help too much.  No incontinence.  No weakness numbness or paresthesias.       Past Medical History:  Diagnosis Date   GERD (gastroesophageal reflux disease)     Patient Active Problem List   Diagnosis Date Noted   Bug bites 03/27/2014   Physical exam, annual 03/27/2014   Arm pain 03/01/2014   GERD (gastroesophageal reflux disease) 01/11/2014    Past Surgical History:  Procedure Laterality Date   APPENDECTOMY         Family History  Problem Relation Age of Onset   Diabetes Mother    Heart disease Father    Hypertension Father    Alzheimer's disease Maternal Grandmother    Cancer Maternal Grandfather    Diabetes Paternal Grandmother    Stroke Paternal Grandmother     Social History   Tobacco Use   Smoking status: Former   Smokeless tobacco: Never  Substance Use Topics   Alcohol use: Yes    Comment: 2-4x/month   Drug use: No    Home Medications Prior to Admission medications   Medication Sig Start Date End Date Taking? Authorizing Provider  meloxicam (MOBIC) 7.5 MG tablet Take 1 tablet (7.5 mg total) by mouth daily. 05/20/21  Yes Eugenia Eldredge, Barbara Cower, MD  methocarbamol (ROBAXIN) 500 MG tablet Take 1 tablet (500 mg total) by  mouth every 8 (eight) hours as needed for muscle spasms. 05/20/21  Yes Jarah Pember, Barbara Cower, MD  predniSONE (DELTASONE) 20 MG tablet 3 tabs po daily x 3 days, then 2 tabs x 3 days, then 1.5 tabs x 3 days, then 1 tab x 3 days, then 0.5 tabs x 3 days 05/20/21  Yes Braylen Staller, Barbara Cower, MD  esomeprazole (NEXIUM) 40 MG capsule Take 40 mg by mouth daily. 12/16/15   [provider]  fenofibrate 160 MG tablet Take 160 mg by mouth daily. 12/16/15   [provider]  Multiple Vitamin (MULTIVITAMIN) tablet Take 1 tablet by mouth daily.    [provider]  naproxen (NAPROSYN) 500 MG tablet Take 1 Naprosyn every 12 hours for swelling or discomfort 04/26/21   Bethann Berkshire, MD  OMEGA-3 KRILL OIL PO Take 1 capsule by mouth daily.    [provider]    Allergies    Patient has no known allergies.  Review of Systems   Review of Systems  All other systems reviewed and are negative.  Physical Exam Updated Vital Signs BP 121/86   Pulse 79   Temp 98 F (36.7 C) (Oral)   Resp 18   Ht 5\' 9"  (1.753 m)   Wt (!) 140.6 kg   SpO2 98%   BMI 45.78 kg/m  Physical Exam Vitals and nursing note reviewed.  Constitutional:      Appearance: He is well-developed.  HENT:     Head: Normocephalic and atraumatic.     Nose: Nose normal. No congestion or rhinorrhea.     Mouth/Throat:     Mouth: Mucous membranes are moist.     Pharynx: Oropharynx is clear.  Eyes:     Pupils: Pupils are equal, round, and reactive to light.  Cardiovascular:     Rate and Rhythm: Normal rate.  Pulmonary:     Effort: Pulmonary effort is normal. No respiratory distress.  Abdominal:     General: Abdomen is flat. There is no distension.  Musculoskeletal:        General: Tenderness (She has mild bilateral lower back tenderness mostly paraspinal.) present. Normal range of motion.     Cervical back: Normal range of motion.  Skin:    General: Skin is warm and dry.  Neurological:     General: No focal deficit present.      Mental Status: He is alert.     Comments: Intact sensation to light touch in bilateral distal extremities, intact and symmetric motor bilateral lower distal extremities.    ED Results / Procedures / Treatments   Labs (all labs ordered are listed, but only abnormal results are displayed) Labs Reviewed - No data to display  EKG None  Radiology No results found.  Procedures Procedures   Medications Ordered in ED Medications  oxyCODONE-acetaminophen (PERCOCET/ROXICET) 5-325 MG per tablet 2 tablet (2 tablets Oral Given 05/21/21 0010)  ketorolac (TORADOL) injection 60 mg (60 mg Intramuscular Given 05/21/21 0011)  methylPREDNISolone sodium succinate (SOLU-MEDROL) 125 mg/2 mL injection 125 mg (125 mg Intramuscular Given 05/21/21 0011)  methocarbamol (ROBAXIN) tablet 500 mg (500 mg Oral Given 05/21/21 0010)    ED Course  I have reviewed the triage vital signs and the nursing notes.  Pertinent labs & imaging results that were available during my care of the patient were reviewed by me and considered in my medical decision making (see chart for details).    MDM Rules/Calculators/A&P                         Patient presents the emergency department today with atraumatic lower back pain.  The differential is large and includes fracture, epidural abscess, transverse myelitis, sciatica, musculoskeletal causes, hematoma.  However without any red flags such as trauma, unexplained weight loss, neuro findings, age, fever, IVDU, steroid use, cancer history, length of symptoms I don't see an indication for imaging. I think the pain is most likely from muscle spasm. I will treat supportively with PCP follow up for improvement and further consideration. Return here if worsening symptoms.    Final Clinical Impression(s) / ED Diagnoses Final diagnoses:  Acute bilateral low back pain with right-sided sciatica    Rx / DC Orders ED Discharge Orders          Ordered    meloxicam (MOBIC) 7.5 MG  tablet  Daily        05/20/21 2351    methocarbamol (ROBAXIN) 500 MG tablet  Every 8 hours PRN        05/20/21 2351    predniSONE (DELTASONE) 20 MG tablet        05/20/21 2351             Mellany Dinsmore, Barbara Cower, MD 05/21/21 0122

## 2021-05-21 NOTE — ED Notes (Signed)
Went over d/c with patient and wife. Verbalized understanding.wheeled out to lobby to passenger side of car

## 2022-04-11 ENCOUNTER — Emergency Department (HOSPITAL_COMMUNITY): Payer: 59

## 2022-04-11 ENCOUNTER — Encounter (HOSPITAL_COMMUNITY): Payer: Self-pay | Admitting: *Deleted

## 2022-04-11 ENCOUNTER — Emergency Department (HOSPITAL_COMMUNITY)
Admission: EM | Admit: 2022-04-11 | Discharge: 2022-04-11 | Disposition: A | Discharge status: Home / Self Care | Payer: 59 | Attending: Emergency Medicine | Admitting: Emergency Medicine

## 2022-04-11 ENCOUNTER — Other Ambulatory Visit: Payer: Self-pay

## 2022-04-11 DIAGNOSIS — R5383 Other fatigue: Secondary | ICD-10-CM | POA: Insufficient documentation

## 2022-04-11 DIAGNOSIS — Z87891 Personal history of nicotine dependence: Secondary | ICD-10-CM | POA: Diagnosis not present

## 2022-04-11 DIAGNOSIS — N200 Calculus of kidney: Secondary | ICD-10-CM

## 2022-04-11 DIAGNOSIS — R5381 Other malaise: Secondary | ICD-10-CM | POA: Diagnosis not present

## 2022-04-11 DIAGNOSIS — Z20822 Contact with and (suspected) exposure to covid-19: Secondary | ICD-10-CM | POA: Diagnosis not present

## 2022-04-11 DIAGNOSIS — R109 Unspecified abdominal pain: Secondary | ICD-10-CM | POA: Diagnosis present

## 2022-04-11 DIAGNOSIS — N132 Hydronephrosis with renal and ureteral calculous obstruction: Secondary | ICD-10-CM | POA: Insufficient documentation

## 2022-04-11 LAB — COMPREHENSIVE METABOLIC PANEL
ALT: 49 U/L — ABNORMAL HIGH (ref 0–44)
AST: 39 U/L (ref 15–41)
Albumin: 4.4 g/dL (ref 3.5–5.0)
Alkaline Phosphatase: 45 U/L (ref 38–126)
Anion gap: 10 (ref 5–15)
BUN: 17 mg/dL (ref 6–20)
CO2: 25 mmol/L (ref 22–32)
Calcium: 9.2 mg/dL (ref 8.9–10.3)
Chloride: 101 mmol/L (ref 98–111)
Creatinine, Ser: 0.99 mg/dL (ref 0.61–1.24)
GFR, Estimated: >60 mL/min (ref >60)
Glucose, Bld: 123 mg/dL — ABNORMAL HIGH (ref 70–99)
Potassium: 3.9 mmol/L (ref 3.5–5.1)
Sodium: 136 mmol/L (ref 135–145)
Total Bilirubin: 0.8 mg/dL (ref 0.3–1.2)
Total Protein: 8.1 g/dL (ref 6.5–8.1)

## 2022-04-11 LAB — URINALYSIS, ROUTINE W REFLEX MICROSCOPIC
Bacteria, UA: NONE SEEN
Bilirubin Urine: NEGATIVE
Glucose, UA: NEGATIVE mg/dL
Ketones, ur: NEGATIVE mg/dL
Leukocytes,Ua: NEGATIVE
Nitrite: NEGATIVE
Protein, ur: NEGATIVE mg/dL
Specific Gravity, Urine: 1.012 (ref 1.005–1.030)
pH: 6 (ref 5.0–8.0)

## 2022-04-11 LAB — CBC WITH DIFFERENTIAL/PLATELET
Abs Immature Granulocytes: 0.01 10*3/uL (ref 0.00–0.07)
Basophils Absolute: 0 10*3/uL (ref 0.0–0.1)
Basophils Relative: 0 %
Eosinophils Absolute: 0 10*3/uL (ref 0.0–0.5)
Eosinophils Relative: 0 %
HCT: 42.1 % (ref 39.0–52.0)
Hemoglobin: 14.4 g/dL (ref 13.0–17.0)
Immature Granulocytes: 0 %
Lymphocytes Relative: 21 %
Lymphs Abs: 1.1 10*3/uL (ref 0.7–4.0)
MCH: 28.8 pg (ref 26.0–34.0)
MCHC: 34.2 g/dL (ref 30.0–36.0)
MCV: 84.2 fL (ref 80.0–100.0)
Monocytes Absolute: 0.6 10*3/uL (ref 0.1–1.0)
Monocytes Relative: 11 %
Neutro Abs: 3.6 10*3/uL (ref 1.7–7.7)
Neutrophils Relative %: 68 %
Platelets: 233 10*3/uL (ref 150–400)
RBC: 5 MIL/uL (ref 4.22–5.81)
RDW: 12.6 % (ref 11.5–15.5)
WBC: 5.3 10*3/uL (ref 4.0–10.5)
nRBC: 0 % (ref 0.0–0.2)

## 2022-04-11 LAB — LIPASE, BLOOD: Lipase: 32 U/L (ref 11–51)

## 2022-04-11 LAB — ETHANOL: Alcohol, Ethyl (B): <10 mg/dL (ref <10)

## 2022-04-11 LAB — RESP PANEL BY RT-PCR (FLU A&B, COVID) ARPGX2
Influenza A by PCR: NEGATIVE
Influenza B by PCR: NEGATIVE
SARS Coronavirus 2 by RT PCR: NEGATIVE

## 2022-04-11 MED ORDER — ACETAMINOPHEN 325 MG PO TABS
650.0000 mg | ORAL_TABLET | Freq: Once | ORAL | Status: AC
  Administered 2022-04-11: 650 mg via ORAL
  Filled 2022-04-11: qty 2

## 2022-04-11 MED ORDER — KETOROLAC TROMETHAMINE 30 MG/ML IJ SOLN
15.0000 mg | Freq: Once | INTRAMUSCULAR | Status: AC
  Administered 2022-04-11: 15 mg via INTRAVENOUS
  Filled 2022-04-11: qty 1

## 2022-04-11 MED ORDER — ONDANSETRON 4 MG PO TBDP: 4.0000 mg | ORAL_TABLET | Freq: Three times a day (TID) | ORAL | 0 refills | Status: DC | PRN

## 2022-04-11 MED ORDER — SODIUM CHLORIDE 0.9 % IV BOLUS
500.0000 mL | Freq: Once | INTRAVENOUS | Status: AC
  Administered 2022-04-11: 500 mL via INTRAVENOUS

## 2022-04-11 MED ORDER — TAMSULOSIN HCL 0.4 MG PO CAPS: 0.4000 mg | ORAL_CAPSULE | Freq: Every day | ORAL | 0 refills | Status: AC

## 2022-04-11 NOTE — Discharge Instructions (Signed)
Monitor your condition carefully and do not hesitate to return here.  Please take ibuprofen, 400 mg, 3 times daily as well as the prescribed antinausea medicine.

## 2022-04-11 NOTE — ED Triage Notes (Signed)
Pt with abd pain with nausea, denies emesis and diarrhea. + joint pain.  Recent tick bites x 7. Fever off and on.

## 2022-04-11 NOTE — ED Provider Notes (Signed)
[facility] EMERGENCY DEPARTMENT Provider Note   CSN: [id number] Arrival date & time: 04/11/22  1425     History  Chief Complaint  Patient presents with   Abdominal Pain    Aaron Holmes is a 36 y.o. male.  HPI Presents with his wife who assists with the history.  He presents with 3 days of generalized fatigue, intermittent abdominal pain, nausea, malaise.  He states that he is generally well though he has a history of smoking cigarettes.  He takes a multivitamin, no prescription medications daily. 2 months ago the patient had 7 ticks, some which were embedded, but he removed them all.  He was fine in the interval until 3 days ago.    Home Medications Prior to Admission medications   Medication Sig Start Date End Date Taking? Authorizing Provider  ondansetron (ZOFRAN-ODT) 4 MG disintegrating tablet Take 1 tablet (4 mg total) by mouth every 8 (eight) hours as needed for nausea or vomiting. 04/11/22  Yes Gerhard Munch, MD  tamsulosin (FLOMAX) 0.4 MG CAPS capsule Take 1 capsule (0.4 mg total) by mouth daily after supper for 7 days. 04/11/22 04/18/22 Yes Gerhard Munch, MD  esomeprazole (NEXIUM) 40 MG capsule Take 40 mg by mouth daily. 12/16/15   [provider]  fenofibrate 160 MG tablet Take 160 mg by mouth daily. 12/16/15   [provider]  meloxicam (MOBIC) 7.5 MG tablet Take 1 tablet (7.5 mg total) by mouth daily. 05/20/21   Mesner, Barbara Cower, MD  methocarbamol (ROBAXIN) 500 MG tablet Take 1 tablet (500 mg total) by mouth every 8 (eight) hours as needed for muscle spasms. 05/20/21   Mesner, Barbara Cower, MD  Multiple Vitamin (MULTIVITAMIN) tablet Take 1 tablet by mouth daily.    [provider]  naproxen (NAPROSYN) 500 MG tablet Take 1 Naprosyn every 12 hours for swelling or discomfort 04/26/21   Bethann Berkshire, MD  OMEGA-3 KRILL OIL PO Take 1 capsule by mouth daily.    [provider]  predniSONE (DELTASONE) 20 MG tablet 3 tabs po daily x 3 days, then 2 tabs x  3 days, then 1.5 tabs x 3 days, then 1 tab x 3 days, then 0.5 tabs x 3 days 05/20/21   Mesner, Barbara Cower, MD      Allergies    Patient has no known allergies.    Review of Systems   Review of Systems  All other systems reviewed and are negative.   Physical Exam Updated Vital Signs BP (!) 130/94 (BP Location: Right Arm)   Pulse 94   Temp 99.8 F (37.7 C) (Oral)   Resp 18   Ht 5\' 9"  (1.753 m)   Wt 92.1 kg   SpO2 100%   BMI 29.98 kg/m  Physical Exam Vitals and nursing note reviewed.  Constitutional:      General: He is not in acute distress.    Appearance: He is well-developed.  HENT:     Head: Normocephalic and atraumatic.  Eyes:     Conjunctiva/sclera: Conjunctivae normal.  Cardiovascular:     Rate and Rhythm: Normal rate and regular rhythm.  Pulmonary:     Effort: Pulmonary effort is normal. No respiratory distress.     Breath sounds: No stridor.  Abdominal:     General: There is no distension.  Skin:    General: Skin is warm and dry.  Neurological:     Mental Status: He is alert and oriented to person, place, and time.     ED Results /  Procedures / Treatments   Labs (all labs ordered are listed, but only abnormal results are displayed) Labs Reviewed  COMPREHENSIVE METABOLIC PANEL - Abnormal; Notable for the following components:      Result Value   Glucose, Bld 123 (*)    ALT 49 (*)    All other components within normal limits  URINALYSIS, ROUTINE W REFLEX MICROSCOPIC - Abnormal; Notable for the following components:   Hgb urine dipstick MODERATE (*)    All other components within normal limits  RESP PANEL BY RT-PCR (FLU A&B, COVID) ARPGX2  ETHANOL  LIPASE, BLOOD  CBC WITH DIFFERENTIAL/PLATELET    EKG None  Radiology CT Renal Stone Study  Result Date: 04/11/2022 CLINICAL DATA:  Flank pain. EXAM: CT ABDOMEN AND PELVIS WITHOUT CONTRAST TECHNIQUE: Multidetector CT imaging of the abdomen and pelvis was performed following the standard protocol without IV  contrast. RADIATION DOSE REDUCTION: This exam was performed according to the departmental dose-optimization program which includes automated exposure control, adjustment of the mA and/or kV according to patient size and/or use of iterative reconstruction technique. COMPARISON:  None Available. FINDINGS: Lower chest: There is atelectasis in the lung bases Hepatobiliary: No focal liver abnormality is seen. No gallstones, gallbladder wall thickening, or biliary dilatation. Pancreas: Unremarkable. No pancreatic ductal dilatation or surrounding inflammatory changes. Spleen: Normal in size without focal abnormality. Adrenals/Urinary Tract: There is a 2 mm calculus in the distal left ureter just proximal to the left ureterovesicular junction. There is mild left-sided hydronephrosis. No additional urinary tract calculi are seen. Kidneys are normal in size. Adrenal glands and bladder are within normal limits. Stomach/Bowel: Stomach is within normal limits. Appendix is surgically absent. No evidence of bowel wall thickening, distention, or inflammatory changes. Vascular/Lymphatic: No significant vascular findings are present. No enlarged abdominal or pelvic lymph nodes. Reproductive: Prostate is unremarkable. Other: Small fat containing right inguinal and umbilical hernias. No ascites. Musculoskeletal: No acute or significant osseous findings. IMPRESSION: 1. 1 mm calculus in the distal left ureter with mild obstructive uropathy. Electronically Signed   By: Darliss Cheney M.D.   On: 04/11/2022 17:54   DG Chest 2 View  Result Date: 04/11/2022 CLINICAL DATA:  Fever. EXAM: CHEST - 2 VIEW COMPARISON:  None Available. FINDINGS: The heart size and mediastinal contours are within normal limits. Normal pulmonary vascularity. Mild retrocardiac left lower lobe subsegmental atelectasis. No focal consolidation, pleural effusion, or pneumothorax. No acute osseous abnormality. IMPRESSION: 1. No acute cardiopulmonary disease.  Electronically Signed   By: Obie Dredge M.D.   On: 04/11/2022 16:16    Procedures Procedures    Medications Ordered in ED Medications  sodium chloride 0.9 % bolus 500 mL (0 mLs Intravenous Stopped 04/11/22 1646)  acetaminophen (TYLENOL) tablet 650 mg (650 mg Oral Given 04/11/22 1739)  ketorolac (TORADOL) 30 MG/ML injection 15 mg (15 mg Intravenous Given 04/11/22 1739)    ED Course/ Medical Decision Making/ A&P This patient with a Hx of cigarette addiction presents to the ED for concern of flulike illness, this involves an extensive number of treatment options, and is a complaint that carries with it a high risk of complications and morbidity.    The differential diagnosis includes influenza, COVID, pneumonia, bacteremia, sepsis, tickborne processes   Social Determinants of Health:  Cigarette addiction  Additional history obtained:  Additional history and/or information obtained from wife, notable for HPI details above   After the initial evaluation, orders, including: Labs x-ray fluids were initiated.   Patient placed on Cardiac and Pulse-Oximetry Monitors.  The patient was maintained on a cardiac monitor.  The cardiac monitored showed an rhythm of 90 sinus normal The patient was also maintained on pulse oximetry. The readings were typically 100% room air normal   On repeat evaluation of the patient improved  Lab Tests:  I personally interpreted labs.  The pertinent results include: Hematuria otherwise labs reassuring  Imaging Studies ordered:  I independently visualized and interpreted imaging which showed 1 mm left distal ureteral stone I agree with the radiologist interpretation  Dispostion / Final MDM:  After consideration of the diagnostic results and the patient's response to treatment, adult male with generally good health presents with 3 days nausea, vomiting, abdominal pain.  Patient found to have kidney stone.  No evidence for infection, bacteremia, sepsis.  No  evidence for obstruction, and the patient improved, was calm, hemodynamically unremarkable here.  Patient appropriate for outpatient follow-up with urology.  Final Clinical Impression(s) / ED Diagnoses Final diagnoses:  Nephrolithiasis    Rx / DC Orders ED Discharge Orders          Ordered    ondansetron (ZOFRAN-ODT) 4 MG disintegrating tablet  Every 8 hours PRN        04/11/22 1829    tamsulosin (FLOMAX) 0.4 MG CAPS capsule  Daily after supper        04/11/22 1829              Gerhard Munch, MD 04/11/22 1829

## 2024-01-12 ENCOUNTER — Ambulatory Visit: Admitting: Urology

## 2024-01-12 VITALS — BP 136/81 | HR 101 | Ht 69.0 in | Wt 210.0 lb

## 2024-01-12 DIAGNOSIS — Z3009 Encounter for other general counseling and advice on contraception: Secondary | ICD-10-CM

## 2024-01-12 NOTE — Progress Notes (Signed)
 01/12/2024 9:18 AM   Ruthine Dose 1986-04-21 [id number]  Referring provider: Kandyce Rud, MD [phone] [address] [facility] [place],  [place] [address]  Chief Complaint  Patient presents with   VAS Consult    HPI: Aaron Holmes is a 38 y.o. male who presents for vasectomy counseling.  Married with 2 children Passed ureteral stone a few years ago but denies chronic scrotal pain, epididymitis or orchitis No previous history inguinal hernia or pelvic surgery No history of bleeding or clotting disorders   PMH: Past Medical History:  Diagnosis Date   GERD (gastroesophageal reflux disease)     Surgical History: Past Surgical History:  Procedure Laterality Date   APPENDECTOMY      Home Medications:  Allergies as of 01/12/2024   No Known Allergies      Medication List        Accurate as of January 12, 2024  9:18 AM. If you have any questions, ask your nurse or doctor.          STOP taking these medications    esomeprazole 40 MG capsule Commonly known as: NEXIUM Stopped by: Riki Altes   fenofibrate 160 MG tablet Stopped by: Riki Altes   meloxicam 7.5 MG tablet Commonly known as: Mobic Stopped by: Riki Altes   methocarbamol 500 MG tablet Commonly known as: ROBAXIN Stopped by: Verna Czech Precilla Purnell   OMEGA-3 KRILL OIL PO Stopped by: Verna Czech Boni Maclellan   ondansetron 4 MG disintegrating tablet Commonly known as: ZOFRAN-ODT Stopped by: Riki Altes   predniSONE 20 MG tablet Commonly known as: DELTASONE Stopped by: Riki Altes       TAKE these medications    diphenhydrAMINE 25 mg capsule Commonly known as: BENADRYL Take 25 mg by mouth every 6 (six) hours as needed.   famotidine 20 MG tablet Commonly known as: PEPCID Take by mouth.   multivitamin tablet Take 1 tablet by mouth daily.   naproxen 500 MG tablet Commonly known as: NAPROSYN Take 1 Naprosyn every 12 hours for swelling or  discomfort        Allergies: No Known Allergies  Family History: Family History  Problem Relation Age of Onset   Diabetes Mother    Heart disease Father    Hypertension Father    Alzheimer's disease Maternal Grandmother    Cancer Maternal Grandfather    Diabetes Paternal Grandmother    Stroke Paternal Grandmother     Social History:  reports that he has quit smoking. He has never used smokeless tobacco. He reports current alcohol use. He reports that he does not use drugs.   Physical Exam: BP 136/81   Pulse (!) 101   Ht 5\' 9"  (1.753 m)   Wt 210 lb (95.3 kg)   BMI 31.01 kg/m   Constitutional:  Alert and oriented, No acute distress. HEENT: [place] AT Respiratory: Normal respiratory effort, no increased work of breathing. GU: Phallus without lesions, testes descended bilaterally without masses or tenderness, spermatic cord/epididymis palpably normal bilaterally.  Vasa palpable bilaterally Psychiatric: Normal mood and affect.   Assessment & Plan:    1.  Undesired fertility Desires to schedule vasectomy We had a long discussion about vasectomy. We specifically discussed the procedure, recovery and the risks, benefits and alternatives of vasectomy. I explained that the procedure entails removal of a segment of each vas deferens, each of which conducts sperm, and that the purpose of this procedure is to  cause sterility (inability to produce children or cause pregnancy). Vasectomy is intended to be permanent and irreversible form of contraception. Options for fertility after vasectomy include vasectomy reversal, or sperm retrieval with in vitro fertilization. These options are not always successful, and they may be expensive. We discussed reversible forms of birth control such as condoms, IUD or diaphragms, as well as the option of freezing sperm in a sperm bank prior to the vasectomy procedure. We discussed the importance of avoiding strenuous exercise for four days after vasectomy, and  the importance of refraining from any form of ejaculation for seven days after vasectomy. I explained that vasectomy does not produce immediate sterility so another form of contraceptive must be used until sterility is assured by having semen checked for sperm. Thus, a post vasectomy semen analysis is necessary to confirm sterility. Rarely, vasectomy must be repeated. We discussed the approximately 1 in 2,000 risk of pregnancy after vasectomy for men who have post-vasectomy semen analysis showing absent sperm or rare non-motile sperm. Typical side effects include a small amount of oozing blood, some discomfort and mild swelling in the area of incision.  Vasectomy does not affect sexual performance, function, please, sensation, interest, desire, satisfaction, penile erection, volume of semen or ejaculation. Other rare risks include allergy or adverse reaction to an anesthetic, testicular atrophy, hematoma, infection/abscess, prolonged tenderness of the vas deferens, pain, swelling, painful nodule or scar (called sperm granuloma) or epididymtis. We discussed chronic testicular pain syndrome. This has been reported to occur in as many as 1-2% of men and may be permanent. This can be treated with medication, small procedures or (rarely) surgery. He indicated he would call back if he desires a preprocedure anxiolytic and would need a driver if utilizing   Riki Altes, MD  [facility] [address], [address] [place], [place] [address] [phone]

## 2024-01-12 NOTE — Patient Instructions (Signed)
 Pre-Vasectomy Instructions ? ?STOP all aspirin or blood thinners (Aspirin, Plavix, Coumadin, Warfarin, Motrin, Ibuprofen, Advil, Aleve, Naproxen, Naprosyn) for 7 days prior to the procedure.  If you have any questions about stopping these medications please contact your primary care physician or cardiologist. ? ?Shave all hair from the upper scrotum on the day of the procedure.  This means just under the penis onto the scrotal sac.  The area shaved should measure about 2-3 inches around.  You may lather the scrotum with soap and water, and shave with a safety razor. ? ?After shaving the area, thoroughly wash the penis and the scrotum, then shower or bathe to remove all the loose hairs.  If needed, wash the area again just before coming in for your Vasectomy. ? ?It is recommended to have a light meal an hour or so prior to the procedure. ? ?Bring a scrotal support (jock strap or suspensory, or tight jockey shorts or underwear).  Wear comfortable pants or shorts. ? ?While the actual procedure usually takes about 45 minutes, you should be prepared to stay in the office for approximately one hour.  Bring someone with you to drive you home. ? ?If you have any questions or concerns, please feel free to call the office at [phone]. ?  ?

## 2024-01-15 ENCOUNTER — Encounter: Payer: Self-pay | Admitting: Urology

## 2024-03-17 ENCOUNTER — Ambulatory Visit (INDEPENDENT_AMBULATORY_CARE_PROVIDER_SITE_OTHER): Admitting: Urology

## 2024-03-17 ENCOUNTER — Encounter: Payer: Self-pay | Admitting: Urology

## 2024-03-17 VITALS — BP 111/76 | HR 74

## 2024-03-17 DIAGNOSIS — Z302 Encounter for sterilization: Secondary | ICD-10-CM

## 2024-03-17 MED ORDER — HYDROCODONE-ACETAMINOPHEN 5-325 MG PO TABS: 1.0000 | ORAL_TABLET | ORAL | 0 refills | Status: AC | PRN

## 2024-03-17 NOTE — Patient Instructions (Signed)

## 2024-03-17 NOTE — Progress Notes (Signed)
   Vasectomy Procedure Note  Indications: Aaron Holmes is a 38 y.o. male who presents today for elective sterilization.  He has been consented for the procedure.  He is aware of the risks and benefits.  He had no additional questions.  He agrees to proceed.  He denies any other significant change since his last visit.  Pre-operative Diagnosis: Elective sterilization  Post-operative Diagnosis: Elective sterilization  Premedication: N/A  Surgeon: Geralyn Knee C. Vollie Brunty, M.D  Description: The patient was prepped and draped in the standard fashion.  The right vas deferens was identified and brought superiorly to the anterior scrotal skin.  The skin and vas were then anesthetized utilizing 8 ml 1% lidocaine .  A small stab incision was made and spread with the vas dissector.  The vas was grasped utilizing the vas clamp and elevated out of the incision.   The vas was grasped utilizing the vas clamp and the vas sheath was incised.  The vas was dissected out of the sheath, elevated out of the incision and dissected free from the surrounding tissue and vessels.  A 1 cm segment was excised.  Mucosal cautery was performed on both ends for a distance of 1 cm and fascial interposition was performed on 1 end with a 3-0 chromic figure-of-eight suture.  No significant bleeding was observed.  The vas ends were then dropped back into the hemiscrotum.  The skin was closed with hemostatic pressure.  An identical procedure was performed on the contralateral side.  Clean dry gauze was applied to the incision sites.  The patient tolerated the procedure well.  Complications:None  Recommendations: 1.  No lifting greater than 10 pounds or strenuous activity for 1 week. 2.  Scrotal support for 1-2 weeks. 3.  May shower in 24 hours; no bath, hot tub for 1 week 4.  No intercourse for at least 7 days and resume based on level of discomfort  5.  Continue alternate contraception for 12 weeks.  6.  Call for significant pain,  swelling, redness, drainage or fever greater than 100.5. 7.  Rx hydrocodone/APAP 5/325 1-2 every 6 hours prn pain. 8.  Follow-up semen analysis in 12 weeks.   Darlynn Elam, MD

## 2024-06-16 ENCOUNTER — Other Ambulatory Visit

## 2024-06-30 ENCOUNTER — Emergency Department
Admission: EM | Admit: 2024-06-30 | Discharge: 2024-06-30 | Disposition: A | Discharge status: Home / Self Care | Payer: Worker's Compensation | Attending: Emergency Medicine | Admitting: Emergency Medicine

## 2024-06-30 ENCOUNTER — Other Ambulatory Visit: Payer: Self-pay

## 2024-06-30 DIAGNOSIS — L237 Allergic contact dermatitis due to plants, except food: Secondary | ICD-10-CM | POA: Insufficient documentation

## 2024-06-30 DIAGNOSIS — R21 Rash and other nonspecific skin eruption: Secondary | ICD-10-CM | POA: Diagnosis present

## 2024-06-30 MED ORDER — DEXAMETHASONE SODIUM PHOSPHATE 10 MG/ML IJ SOLN
10.0000 mg | Freq: Once | INTRAMUSCULAR | Status: AC
  Administered 2024-06-30: 10 mg via INTRAMUSCULAR
  Filled 2024-06-30: qty 1

## 2024-06-30 MED ORDER — PREDNISONE 10 MG (21) PO TBPK: ORAL_TABLET | ORAL | 0 refills | Status: AC

## 2024-06-30 NOTE — ED Triage Notes (Signed)
 Pt reports he was exposed into poison Ivy on Saturday, and rash has continued to spread. Rash to arms and back. Pt talks in complete sentences no respiratory distress noted

## 2024-06-30 NOTE — ED Provider Notes (Signed)
 [facility] Emergency Department Provider Note     Event Date/Time   First MD Initiated Contact with Patient 06/30/24 2124     (approximate)   History   Rash   HPI  Aaron Holmes is a 38 y.o. male with a past medical history of GERD presents to the ED with complaint of exposure to poison ivy x a week ago.  Symptoms such as itchiness have since improved since onset, however rash continues to spread.  Patient is using Benadryl and hydrocortisone cream with relief.  Denies chest pain, shortness of breath, difficulty or painful swallowing.     Physical Exam   Triage Vital Signs: ED Triage Vitals  Encounter Vitals Group     BP 06/30/24 2036 129/86     Girls Systolic BP Percentile --      Girls Diastolic BP Percentile --      Boys Systolic BP Percentile --      Boys Diastolic BP Percentile --      Pulse Rate 06/30/24 2036 91     Resp 06/30/24 2036 16     Temp 06/30/24 2036 98.3 F (36.8 C)     Temp Source 06/30/24 2036 Oral     SpO2 06/30/24 2036 96 %     Weight 06/30/24 2038 205 lb (93 kg)     Height 06/30/24 2038 5' 9 (1.753 m)     Head Circumference --      Peak Flow --      Pain Score 06/30/24 2038 1     Pain Loc --      Pain Education --      Exclude from Growth Chart --     Most recent vital signs: Vitals:   06/30/24 2036  BP: 129/86  Pulse: 91  Resp: 16  Temp: 98.3 F (36.8 C)  SpO2: 96%    General Awake, no distress.  HEENT NCAT.  CV:  Good peripheral perfusion.  RESP:  Normal effort.  ABD:  No distention.  Other:  Diffuse maculopapular rash under erythemic base across the back and right forearm.  Pruritic in nature.  ED Results / Procedures / Treatments   Labs (all labs ordered are listed, but only abnormal results are displayed) Labs Reviewed - No data to display  No results found.  PROCEDURES:  Critical Care performed: No  Procedures   MEDICATIONS ORDERED IN ED: Medications  dexamethasone  (DECADRON )  injection 10 mg (has no administration in time range)     IMPRESSION / MDM / ASSESSMENT AND PLAN / ED COURSE  I reviewed the triage vital signs and the nursing notes.                               38 y.o. male presents to the emergency department for evaluation and treatment of rash. See HPI for further details.   Differential diagnosis includes, but is not limited to contact dermatitis, poison ivy exposure, scabies, allergic reaction  Patient's presentation is most consistent with acute, uncomplicated illness.  Patient is alert and oriented.  He is hemodynamic stable.  Physical exam findings are as stated above and consistent with poison ivy exposure.  No airway involvement.  Will order Decadron  injection and continue outpatient steroid use.  Symptomatic treatment care was discussed with patient who verbalized understanding.  He is in stable condition for discharge home.  ED return precaution discussed.  FINAL CLINICAL IMPRESSION(S) / ED  DIAGNOSES   Final diagnoses:  Poison ivy dermatitis   Rx / DC Orders   ED Discharge Orders          Ordered    predniSONE  (STERAPRED UNI-PAK 21 TAB) 10 MG (21) TBPK tablet        06/30/24 2220           Note:  This document was prepared using Dragon voice recognition software and may include unintentional dictation errors.    Margrette, Driana Dazey A, PA-C 06/30/24 2224    Dicky Anes, MD 07/01/24 [phone]

## 2024-06-30 NOTE — Discharge Instructions (Addendum)
 Your evaluated in the ED for poison ivy exposure.  Please continue Benadryl for itching as needed.  Ensure proper hand hygiene to prevent transmission.  Consider symptomatic treatment with - Oatmeal bath - Cool, wet compresses - Ice packs - Calamine lotion  Follow-up with your primary care as needed.

## 2024-06-30 NOTE — ED Notes (Addendum)
 Workman's comp drug/drug and alcohol screen not performed because pt did not come in with a paper from work or supervisor stating he needed to have these tests performed.

## 2024-07-11 ENCOUNTER — Other Ambulatory Visit

## 2024-07-11 DIAGNOSIS — Z302 Encounter for sterilization: Secondary | ICD-10-CM

## 2024-07-13 ENCOUNTER — Ambulatory Visit: Payer: Self-pay | Admitting: Urology

## 2024-07-13 LAB — POST-VAS SPERM EVALUATION,QUAL: Volume: 5.7 mL
